# Patient Record
Sex: Female | Born: 1990 | Hispanic: Yes | Marital: Married | Smoking: Never smoker
Health system: Southern US, Community
[De-identification: ages and names within clinical notes are randomized; demographics above are authoritative.]

---
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
## 2012-01-27 ENCOUNTER — Encounter: Payer: Self-pay | Admitting: Gynecology

## 2012-01-27 ENCOUNTER — Ambulatory Visit (INDEPENDENT_AMBULATORY_CARE_PROVIDER_SITE_OTHER): Payer: BC Managed Care – PPO | Admitting: Gynecology

## 2012-01-27 VITALS — BP 120/78 | Ht 64.0 in | Wt 144.0 lb

## 2012-01-27 DIAGNOSIS — N946 Dysmenorrhea, unspecified: Secondary | ICD-10-CM

## 2012-01-27 DIAGNOSIS — Z833 Family history of diabetes mellitus: Secondary | ICD-10-CM

## 2012-01-27 DIAGNOSIS — R635 Abnormal weight gain: Secondary | ICD-10-CM

## 2012-01-27 DIAGNOSIS — Z23 Encounter for immunization: Secondary | ICD-10-CM

## 2012-01-27 MED ORDER — MEFENAMIC ACID 250 MG PO CAPS: 250.0000 mg | ORAL_CAPSULE | Freq: Four times a day (QID) | ORAL | Status: AC | PRN

## 2012-01-27 NOTE — Patient Instructions (Signed)
zxBreast Self-Exam A self breast exam may help you find changes or problems while they are still small. Do a breast self-exam:  Every month.   One week after your period (menstrual period).   On the first day of each month if you do not have periods anymore.  Look for any:  Change in breast color, size, or shape.   Dimples in your breast.   Changes in your nipples or skin.   Dry skin on your breasts or nipples.   Watery or bloody discharge from your nipples.   Feel for:  Lumps.   Thick, hard places.   Any other changes.  HOME CARE There are 3 ways to do the breast self-exam: In front of a mirror.  Lift your arms over your head and turn side to side.   Put your hands on your hips and lean down, then turn from side to side.   Bend forward and turn from side to side.  In the shower.  With soapy hands, check both breasts. Then check above and below your collarbone and your armpits.   Feel above and below your collarbone down to under your breast, and from the center of your chest to the outer edge of the armpit. Check for any lumps or hard spots.   Using the tips of your middle three fingers check your whole breast by pressing your hand over your breast in a circle or in an up and down motion.  Lying down.  Lie flat on your bed.   Put a small pillow under the breast you are going to check. On that same side, put your hand behind your head.   With your other hand, use the 3 middle fingers to feel the breast.   Move your fingers in a circle around the breast. Press firmly over all parts of the breast to feel for any lumps.  GET HELP RIGHT AWAY IF: You find any changes in your breasts so they can be checked. Document Released: 03/31/2008 Document Revised: 10/02/2011 Document Reviewed: 01/31/2009 [facility] Patient Information 2012 [place], [place].

## 2012-01-27 NOTE — Progress Notes (Signed)
The patient is an 21 year old who presented to the office today with her mother to get established with a gynecologist. Patient is not sexually active has been followed by her pediatrician on a regular basis and states or vaccinations are up-to-date. Patient was interested in receiving the   Gardasil  vaccine today. Patient's mother states that her daughter has achieved the normal developmental milestones. She is having normal menstrual cycles although suffers from 2-3 days of cramping. Patient concern was overweight. She weights 144 pounds do with place her BMI at 24.72. We discussed importance of good nutrition low-fat diet cutting down sodas and sweets. We discussed importance of regular exercise 3-4 times a week. We also discussed today and information was provided on the importance of monthly self breast examinations. She does have a strong family history of diabetes and for this reason we'll do a hemoglobin A1c along with CBC and urinalysis. We discussed the new Pap smear screening guidelines. She will not need a Pap smear until the age of 40. Her dysmenorrhea  She will be prescribed Ponstel 250 mg to take 1 by mouth 4 times a day when necessary. Consent form was signed and she received the first of a series of 3 Gardasil vaccine. Literature information on exercise and diet was provided as well as on self breast exam.

## 2012-01-28 LAB — URINALYSIS W MICROSCOPIC + REFLEX CULTURE
Bilirubin Urine: NEGATIVE
Casts: NONE SEEN
Glucose, UA: NEGATIVE mg/dL
Leukocytes, UA: NEGATIVE
Squamous Epithelial / LPF: NONE SEEN
pH: 6 (ref 5.0–8.0)

## 2012-01-28 LAB — CBC WITH DIFFERENTIAL/PLATELET
Eosinophils Absolute: 0 10*3/uL (ref 0.0–0.7)
HCT: 39.4 % (ref 36.0–46.0)
Hemoglobin: 12.9 g/dL (ref 12.0–15.0)
Lymphs Abs: 2 10*3/uL (ref 0.7–4.0)
MCH: 28.5 pg (ref 26.0–34.0)
Monocytes Absolute: 0.6 10*3/uL (ref 0.1–1.0)
Monocytes Relative: 6 % (ref 3–12)
Neutro Abs: 6.7 10*3/uL (ref 1.7–7.7)
Neutrophils Relative %: 72 % (ref 43–77)
RBC: 4.53 MIL/uL (ref 3.87–5.11)

## 2012-01-28 LAB — HEMOGLOBIN A1C: Hgb A1c MFr Bld: 5.4 % (ref <5.7)

## 2012-01-29 ENCOUNTER — Other Ambulatory Visit: Payer: Self-pay

## 2012-01-29 DIAGNOSIS — R3129 Other microscopic hematuria: Secondary | ICD-10-CM

## 2012-02-18 ENCOUNTER — Other Ambulatory Visit: Payer: BC Managed Care – PPO

## 2012-02-18 DIAGNOSIS — R3129 Other microscopic hematuria: Secondary | ICD-10-CM

## 2012-02-19 LAB — URINALYSIS W MICROSCOPIC + REFLEX CULTURE
Bacteria, UA: NONE SEEN
Bilirubin Urine: NEGATIVE
Casts: NONE SEEN
Crystals: NONE SEEN
Glucose, UA: NEGATIVE mg/dL
Ketones, ur: NEGATIVE mg/dL
Nitrite: NEGATIVE
Specific Gravity, Urine: 1.025 (ref 1.005–1.030)
pH: 6 (ref 5.0–8.0)

## 2012-05-17 ENCOUNTER — Ambulatory Visit (INDEPENDENT_AMBULATORY_CARE_PROVIDER_SITE_OTHER): Payer: BC Managed Care – PPO | Admitting: Anesthesiology

## 2012-05-17 DIAGNOSIS — Z23 Encounter for immunization: Secondary | ICD-10-CM

## 2012-12-06 ENCOUNTER — Emergency Department (HOSPITAL_BASED_OUTPATIENT_CLINIC_OR_DEPARTMENT_OTHER): Payer: BC Managed Care – PPO

## 2012-12-06 ENCOUNTER — Encounter (HOSPITAL_BASED_OUTPATIENT_CLINIC_OR_DEPARTMENT_OTHER): Payer: Self-pay | Admitting: *Deleted

## 2012-12-06 ENCOUNTER — Emergency Department (HOSPITAL_BASED_OUTPATIENT_CLINIC_OR_DEPARTMENT_OTHER)
Admission: EM | Admit: 2012-12-06 | Discharge: 2012-12-07 | Disposition: A | Discharge status: Home / Self Care | Payer: BC Managed Care – PPO | Attending: Emergency Medicine | Admitting: Emergency Medicine

## 2012-12-06 DIAGNOSIS — M940 Chondrocostal junction syndrome [Tietze]: Secondary | ICD-10-CM

## 2012-12-06 DIAGNOSIS — R072 Precordial pain: Secondary | ICD-10-CM | POA: Insufficient documentation

## 2012-12-06 NOTE — ED Notes (Signed)
Pt c/o cough and chest wall pain with movt x 1 hr

## 2012-12-06 NOTE — ED Notes (Signed)
Pt states that she had chest pain which started 1 hour ago while doing nothing, pt denies SOB, fever, or cough. Pt states that pain has now subsided. NAD noted.

## 2012-12-06 NOTE — ED Provider Notes (Signed)
History    This chart was scribed for Nezzie Manera Smitty Cords, MD by Donne Anon, ED Scribe. This patient was seen in room MH09/MH09 and the patient's care was started at 2302.    CSN: [id number]  Arrival date & time 12/06/12  2208   First MD Initiated Contact with Patient 12/06/12 2302      Chief Complaint  Patient presents with  . Cough     Patient is a 22 y.o. female presenting with chest pain. The history is provided by the patient. No language interpreter was used.  Chest Pain Pain location:  Substernal area (at costochondral joint) Pain radiates to:  Does not radiate Pain radiates to the back: no   Pain severity:  Moderate Onset quality:  Gradual Timing:  Intermittent Progression:  Unchanged Chronicity:  New Context: not lifting and no trauma   Relieved by:  None tried Worsened by:  Nothing tried Ineffective treatments: Alieve. Associated symptoms: cough   Associated symptoms: no back pain, no fever, no nausea, no palpitations and no shortness of breath   Cough:    Cough characteristics:  Non-productive   Severity:  Mild   Onset quality:  Gradual   Timing:  Constant Risk factors: no birth control and no prior DVT/PE    Bridgitt Raggio is a 22 y.o. female who presents to the Emergency Department complaining of gradual onset, moderate, intermittent CP with associated cough which began this evening. She denies anylong car rides, plane trips, recent trauma or leg swelling. She has tried [employer] with little relief.  PCP is Dr. Lily Peer in [place].  History reviewed. No pertinent past medical history.  History reviewed. No pertinent past surgical history.  History reviewed. No pertinent family history.  History  Substance Use Topics  . Smoking status: Never Smoker   . Smokeless tobacco: Never Used  . Alcohol Use: No     Review of Systems  Constitutional: Negative for fever.  HENT: Negative for neck pain.   Respiratory: Positive for cough. Negative for  shortness of breath.   Cardiovascular: Positive for chest pain. Negative for palpitations and leg swelling.  Gastrointestinal: Negative for nausea.  Musculoskeletal: Negative for back pain.  All other systems reviewed and are negative.    Allergies  Penicillins  Home Medications   Current Outpatient Rx  Name  Route  Sig  Dispense  Refill  . Mefenamic Acid 250 MG CAPS   Oral   Take 1 capsule (250 mg total) by mouth 4 (four) times daily as needed.   28 each   11     BP 118/86  Pulse 75  Temp(Src) 99 F (37.2 C) (Oral)  Resp 16  Ht 5\' 3"  (1.6 m)  Wt 137 lb (62.143 kg)  BMI 24.27 kg/m2  SpO2 100%  LMP 11/05/2012  Physical Exam  Nursing note and vitals reviewed. Constitutional: She is oriented to person, place, and time. She appears well-developed and well-nourished. No distress.  HENT:  Head: Normocephalic and atraumatic.  Mouth/Throat: Oropharynx is clear and moist. No oropharyngeal exudate.  Eyes: Pupils are equal, round, and reactive to light.  Neck: Normal range of motion. Neck supple. No tracheal deviation present.  Cardiovascular: Normal rate, regular rhythm, normal heart sounds and intact distal pulses.   Pulmonary/Chest: Effort normal and breath sounds normal. She exhibits tenderness.  PERC negative. Wells score zero. Pain along left costochondral border.  Abdominal: Soft. Bowel sounds are normal. There is no tenderness. There is no rebound and no guarding.  Musculoskeletal:  Normal range of motion.  Neurological: She is alert and oriented to person, place, and time.  Skin: Skin is warm and dry.  Psychiatric: She has a normal mood and affect. Her behavior is normal. Thought content normal.    ED Course  Procedures (including critical care time) DIAGNOSTIC STUDIES: Oxygen Saturation is 100% on room air, normal by my interpretation.    COORDINATION OF CARE: 11:08 PM Discussed treatment plan which includes CXR with pt at bedside and pt agreed to plan.      Labs Reviewed - No data to display No results found.   No diagnosis found.    MDM   Date: 12/07/2012  Rate: 81  Rhythm: normal sinus rhythm  QRS Axis: normal  Intervals: normal  ST/T Wave abnormalities: normal  Conduction Disutrbances: none  Narrative Interpretation: unremarkable      Doubt PE also doubt cardiac etiology.    I personally performed the services described in this documentation, which was scribed in my presence. The recorded information has been reviewed and is accurate.         Jasmine Awe, MD 12/07/12 [phone]

## 2012-12-06 NOTE — ED Notes (Signed)
Patient transported to X-ray 

## 2012-12-06 NOTE — ED Notes (Signed)
MD at bedside. 

## 2012-12-07 MED ORDER — IBUPROFEN 600 MG PO TABS: 600.0000 mg | ORAL_TABLET | Freq: Four times a day (QID) | ORAL | Status: DC | PRN

## 2013-01-10 ENCOUNTER — Encounter: Payer: Self-pay | Admitting: Gynecology

## 2013-01-10 ENCOUNTER — Ambulatory Visit: Payer: BC Managed Care – PPO | Admitting: Gynecology

## 2013-01-10 VITALS — BP 118/70

## 2013-01-10 DIAGNOSIS — N946 Dysmenorrhea, unspecified: Secondary | ICD-10-CM

## 2013-01-10 DIAGNOSIS — M940 Chondrocostal junction syndrome [Tietze]: Secondary | ICD-10-CM | POA: Insufficient documentation

## 2013-01-10 DIAGNOSIS — Z23 Encounter for immunization: Secondary | ICD-10-CM

## 2013-01-10 MED ORDER — CYCLOBENZAPRINE HCL 5 MG PO TABS: ORAL_TABLET | ORAL | Status: AC

## 2013-01-10 MED ORDER — IBUPROFEN 800 MG PO TABS: 800.0000 mg | ORAL_TABLET | Freq: Three times a day (TID) | ORAL | Status: DC | PRN

## 2013-01-10 MED ORDER — CYCLOBENZAPRINE HCL 5 MG PO TABS: ORAL_TABLET | ORAL | Status: DC

## 2013-01-10 MED ORDER — PREDNISONE (PAK) 10 MG PO TABS: 10.0000 mg | ORAL_TABLET | Freq: Every day | ORAL | Status: AC

## 2013-01-10 NOTE — Addendum Note (Signed)
Addended by: Ok Edwards on: 01/10/2013 02:07 PM   Modules accepted: Level of Service

## 2013-01-10 NOTE — Progress Notes (Signed)
Patient is a 22 year old who presented to the office today with her mother complaining of substernal discomfort. Patient states is been going on for several weeks. Review of her record indicated that last month she went to the emergency department and had a normal EKG and had been started on nonsteroidals which she never started. She denies any shortness of breath or any lightheadedness or any numbness or tingling her extremities.  Exam: Heart: Regular rate and rhythm no murmurs or gallops mild systolic ejection murmur noted. Lungs: Clear to auscultation Rogers or wheezes Sternum: Tenderness elicited in the lower one third of the sternum costal chondral junction  Assessment/plan: Costochondritis we discussed once again the treatment and I'm going to offer for her and reassess in 10 days. She will be placed on a 6 day steroid pack along with Motrin 800 mg 3 times a day for one week. She will be placed on a muscle relaxant Flexeril 5 mg to take each bedtime for one week. She calcific to Motrin 800 mg for her dysmenorrhea. Patient is not sexually active. If her symptoms continue she will be referred to the cardiologist for further evaluation. This does appear to be costochondritis due to the fact that the symptoms were reproduced on examination.patient received her second dose of the Gardasil Vaccine that was overdue.

## 2013-01-24 ENCOUNTER — Ambulatory Visit (INDEPENDENT_AMBULATORY_CARE_PROVIDER_SITE_OTHER): Payer: BC Managed Care – PPO | Admitting: Gynecology

## 2013-01-24 ENCOUNTER — Encounter: Payer: Self-pay | Admitting: Gynecology

## 2013-01-24 VITALS — BP 112/70

## 2013-01-24 DIAGNOSIS — R5383 Other fatigue: Secondary | ICD-10-CM

## 2013-01-24 DIAGNOSIS — R531 Weakness: Secondary | ICD-10-CM

## 2013-01-24 DIAGNOSIS — R0602 Shortness of breath: Secondary | ICD-10-CM

## 2013-01-24 DIAGNOSIS — R51 Headache: Secondary | ICD-10-CM

## 2013-01-24 DIAGNOSIS — R079 Chest pain, unspecified: Secondary | ICD-10-CM

## 2013-01-24 NOTE — Progress Notes (Signed)
Patient is a 21 year oldwas instructed to return to the office today after treatment for suspected costochondritis. Review my notes from March 17 was as follows:  Patient was complaining of substernal discomfort. Patient states is been going on for several weeks. Review of her record indicated that last month she went to the emergency department and had a normal EKG and had been started on nonsteroidals which she never started.  On recent exam she was tender in the lower one third of the sternum coastal chondral junction. For this reason patient was started on a 6 day steroid pack along with Motrin 800 mg 3 times a day for one week. She was also placed on muscle relaxant Flexeril 5 mg at bedtime for one week. She was informed also that the Motrin 800 mg 3 times a day she could use for her dysmenorrhea. Patient is not sexually active. During that visit on cardiac auscultation there was no murmurs or gallops a small mild systolic ejection murmur was noted. Her lungs were clear to auscultation with no rhonchi or wheezes.  She presented to the office today for followup and stated her symptoms have not improved. She now brought to my attention with her mother being present that when she bends over she feels lightheaded and recently was experiencing shortness of breath with minimal exertion.she also has stated that since she was much younger she suffers from headaches at least 2 times a week. She also brought to my attention that recently she has noted weakness on her grasp that she has dropped things from her hands. Also she had complained of lower extremity (feet), feeling numb. Her mother states that her daughter reached normal developmental milestones. Mother also stated that she was born at term. Mother does state that she sees her at times get red in the face and flush with minimal activity or exertion.  On today's exam: Blood pressure 112/70 Once again the substernal discomfort was reproduced. On cardiac  auscultation and there was a very mild systolic ejection murmur. The lungs were clear to auscultation. She had no motor or sensory deficit on the upper or lower extremities.  I'm going to refer her to my cardiology colleague Dr. York Ram for a full cardiology evaluation. If his findings turned out negative we will then refer her to the neurologist for further assessment.

## 2013-02-04 ENCOUNTER — Other Ambulatory Visit (HOSPITAL_COMMUNITY): Payer: Self-pay | Admitting: Cardiology

## 2013-02-04 DIAGNOSIS — R0602 Shortness of breath: Secondary | ICD-10-CM

## 2013-02-04 DIAGNOSIS — R011 Cardiac murmur, unspecified: Secondary | ICD-10-CM

## 2013-02-06 ENCOUNTER — Encounter: Payer: Self-pay | Admitting: *Deleted

## 2013-02-08 ENCOUNTER — Encounter: Payer: Self-pay | Admitting: Cardiology

## 2013-02-14 ENCOUNTER — Ambulatory Visit (HOSPITAL_COMMUNITY): Payer: BC Managed Care – PPO

## 2013-04-11 ENCOUNTER — Ambulatory Visit: Payer: BC Managed Care – PPO

## 2013-05-12 ENCOUNTER — Telehealth: Payer: Self-pay | Admitting: Cardiology

## 2013-06-06 ENCOUNTER — Telehealth (HOSPITAL_COMMUNITY): Payer: Self-pay | Admitting: Cardiology

## 2013-06-06 NOTE — Telephone Encounter (Signed)
  [place] [facility] LOCATED AT [facility] [address] [place], [place] [address] [phone]   Date:06/06/2013  Dear Dr. Herbie Baltimore  Our office has attempted to contact your patient Kristy [name] (04-04-1991)  twice by telephone and we have also sent an appointment letter to schedule the 2D Echocardiogram and CPET/MET test you ordered. The patient has not responded. We will not make any further attempts to contact the patient. If any further assistance is needed for this referral, please contact our office at [phone]  Sincerely, [facility] Cardiovascular Imaging Scheduling Team

## 2013-06-06 NOTE — Telephone Encounter (Signed)
Cancelled Echo order. Patient never responded to multiple attempts to contact to schedule.

## 2014-01-09 ENCOUNTER — Encounter: Payer: BC Managed Care – PPO | Admitting: Gynecology

## 2014-02-02 ENCOUNTER — Encounter: Payer: BC Managed Care – PPO | Admitting: Gynecology

## 2014-11-02 ENCOUNTER — Emergency Department (HOSPITAL_BASED_OUTPATIENT_CLINIC_OR_DEPARTMENT_OTHER)
Admission: EM | Admit: 2014-11-02 | Discharge: 2014-11-02 | Disposition: A | Discharge status: Home / Self Care | Payer: BLUE CROSS/BLUE SHIELD | Attending: Emergency Medicine | Admitting: Emergency Medicine

## 2014-11-02 ENCOUNTER — Encounter (HOSPITAL_BASED_OUTPATIENT_CLINIC_OR_DEPARTMENT_OTHER): Payer: Self-pay

## 2014-11-02 DIAGNOSIS — Z880 Allergy status to penicillin: Secondary | ICD-10-CM | POA: Insufficient documentation

## 2014-11-02 DIAGNOSIS — J020 Streptococcal pharyngitis: Secondary | ICD-10-CM | POA: Diagnosis not present

## 2014-11-02 DIAGNOSIS — H9202 Otalgia, left ear: Secondary | ICD-10-CM | POA: Diagnosis present

## 2014-11-02 LAB — RAPID STREP SCREEN (MED CTR MEBANE ONLY): STREPTOCOCCUS, GROUP A SCREEN (DIRECT): POSITIVE — AB

## 2014-11-02 MED ORDER — DEXAMETHASONE SODIUM PHOSPHATE 4 MG/ML IJ SOLN: 4.0000 mg | Freq: Once | INTRAMUSCULAR | Status: DC

## 2014-11-02 MED ORDER — NAPROXEN 250 MG PO TABS
500.0000 mg | ORAL_TABLET | Freq: Once | ORAL | Status: AC
  Administered 2014-11-02: 500 mg via ORAL
  Filled 2014-11-02: qty 2

## 2014-11-02 MED ORDER — LIDOCAINE VISCOUS 2 % MT SOLN
15.0000 mL | Freq: Once | OROMUCOSAL | Status: AC
  Administered 2014-11-02: 15 mL via OROMUCOSAL
  Filled 2014-11-02: qty 15

## 2014-11-02 MED ORDER — NAPROXEN 500 MG PO TABS: 500.0000 mg | ORAL_TABLET | Freq: Two times a day (BID) | ORAL | Status: AC

## 2014-11-02 MED ORDER — AZITHROMYCIN 250 MG PO TABS: ORAL_TABLET | ORAL | Status: AC

## 2014-11-02 MED ORDER — AZITHROMYCIN 250 MG PO TABS
500.0000 mg | ORAL_TABLET | Freq: Once | ORAL | Status: AC
  Administered 2014-11-02: 500 mg via ORAL
  Filled 2014-11-02: qty 2

## 2014-11-02 NOTE — ED Provider Notes (Signed)
CSN: [id number]     Arrival date & time 11/02/14  4098 History   First MD Initiated Contact with Patient 11/02/14 [phone]     Chief Complaint  Patient presents with  . Otalgia     (Consider location/radiation/quality/duration/timing/severity/associated sxs/prior Treatment) Patient is a 24 y.o. female presenting with ear pain. The history is provided by the patient.  Otalgia Location:  Left Behind ear:  No abnormality Severity:  Severe Onset quality:  Gradual Duration:  1 day Progression:  Unchanged Chronicity:  New Context: not direct blow   Relieved by:  Nothing Worsened by:  Nothing tried Ineffective treatments:  None tried Associated symptoms: sore throat   Associated symptoms: no congestion and no fever   Risk factors: no recent travel   Seen [facility] for [facility] pain and sore throat.  Was told there were no abnormalities and given samples of a combination pain medication and decongestant.  Pain not improving and came to the ED  History reviewed. No pertinent past medical history. History reviewed. No pertinent past surgical history. No family history on file. History  Substance Use Topics  . Smoking status: Never Smoker   . Smokeless tobacco: Never Used  . Alcohol Use: No   OB History    No data available     Review of Systems  Constitutional: Negative for fever.  HENT: Positive for ear pain and sore throat. Negative for congestion, drooling, facial swelling, trouble swallowing and voice change.   All other systems reviewed and are negative.     Allergies  Penicillins  Home Medications   Prior to Admission medications   Medication Sig Start Date End Date Taking? Authorizing Provider  cyclobenzaprine (FLEXERIL) 5 MG tablet Take one tablet before bedtime for one wee 01/10/13   Ok Edwards, MD  ibuprofen (ADVIL,MOTRIN) 800 MG tablet Take 1 tablet (800 mg total) by mouth every 8 (eight) hours as needed for pain. 01/10/13   Ok Edwards, MD  Mefenamic  Acid 250 MG CAPS Take 1 capsule (250 mg total) by mouth 4 (four) times daily as needed. 01/27/12   Ok Edwards, MD  predniSONE (STERAPRED UNI-PAK) 10 MG tablet Take 1 tablet (10 mg total) by mouth daily. 10 Mg Steroid Pak for 6 days #21 01/10/13   Ok Edwards, MD   BP 119/76 mmHg  Pulse 96  Temp(Src) 99.2 F (37.3 C) (Oral)  Resp 18  Ht  (1.6 m)  Wt 148 lb (67.132 kg)  BMI 26.22 kg/m2  SpO2 100%  LMP 10/26/2014 Physical Exam  Constitutional: She is oriented to person, place, and time. She appears well-developed and well-nourished.  HENT:  Head: Normocephalic and atraumatic.  Right Ear: No mastoid tenderness. Tympanic membrane is not injected, not scarred, not perforated and not erythematous. No hemotympanum.  Left Ear: No mastoid tenderness. Tympanic membrane is not injected, not scarred, not perforated and not erythematous. No hemotympanum.  Nose: No nasal septal hematoma.  Mouth/Throat: Oropharynx is clear and moist. No trismus in the jaw. No uvula swelling. No oropharyngeal exudate, posterior oropharyngeal edema, posterior oropharyngeal erythema or tonsillar abscesses.  Eyes: Conjunctivae are normal. Pupils are equal, round, and reactive to light.  Neck: Normal range of motion. Neck supple. No tracheal deviation present.  No pain with displacement of the trachea.  Phonation intact   Cardiovascular: Normal rate, regular rhythm and intact distal pulses.   Pulmonary/Chest: Effort normal and breath sounds normal. No respiratory distress. She has no wheezes. She has  no rales.  Abdominal: Soft. Bowel sounds are normal. There is no tenderness.  Musculoskeletal: Normal range of motion.  Lymphadenopathy:    She has no cervical adenopathy.  Neurological: She is alert and oriented to person, place, and time.  Skin: Skin is warm and dry.  Psychiatric: She has a normal mood and affect.    ED Course  Procedures (including critical care time) Labs Review Labs Reviewed  RAPID  STREP SCREEN    Imaging Review No results found.   EKG Interpretation None      MDM   Final diagnoses:  None    As B TM are normal I do not think drops will help.  Will treat with macrolide as patient is penicillin allergic.  Follow up with your PMD and ENT for ongoing care.  Salt water gargles TID    Wylee Dorantes K Olukemi Panchal-Rasch, MD 11/02/14 [phone]

## 2014-11-02 NOTE — ED Notes (Signed)
Pt c/o sore throat and lt ear pain, states went to [facility] yesterday given a decongestion with no relief

## 2014-12-29 IMAGING — CR DG CHEST 2V
2 series · 2 of 2 positions shown · non-contrast
Comparison: None.

CLINICAL DATA: Cough and chest pain.

CHEST - 2 VIEW

[w chest pa]
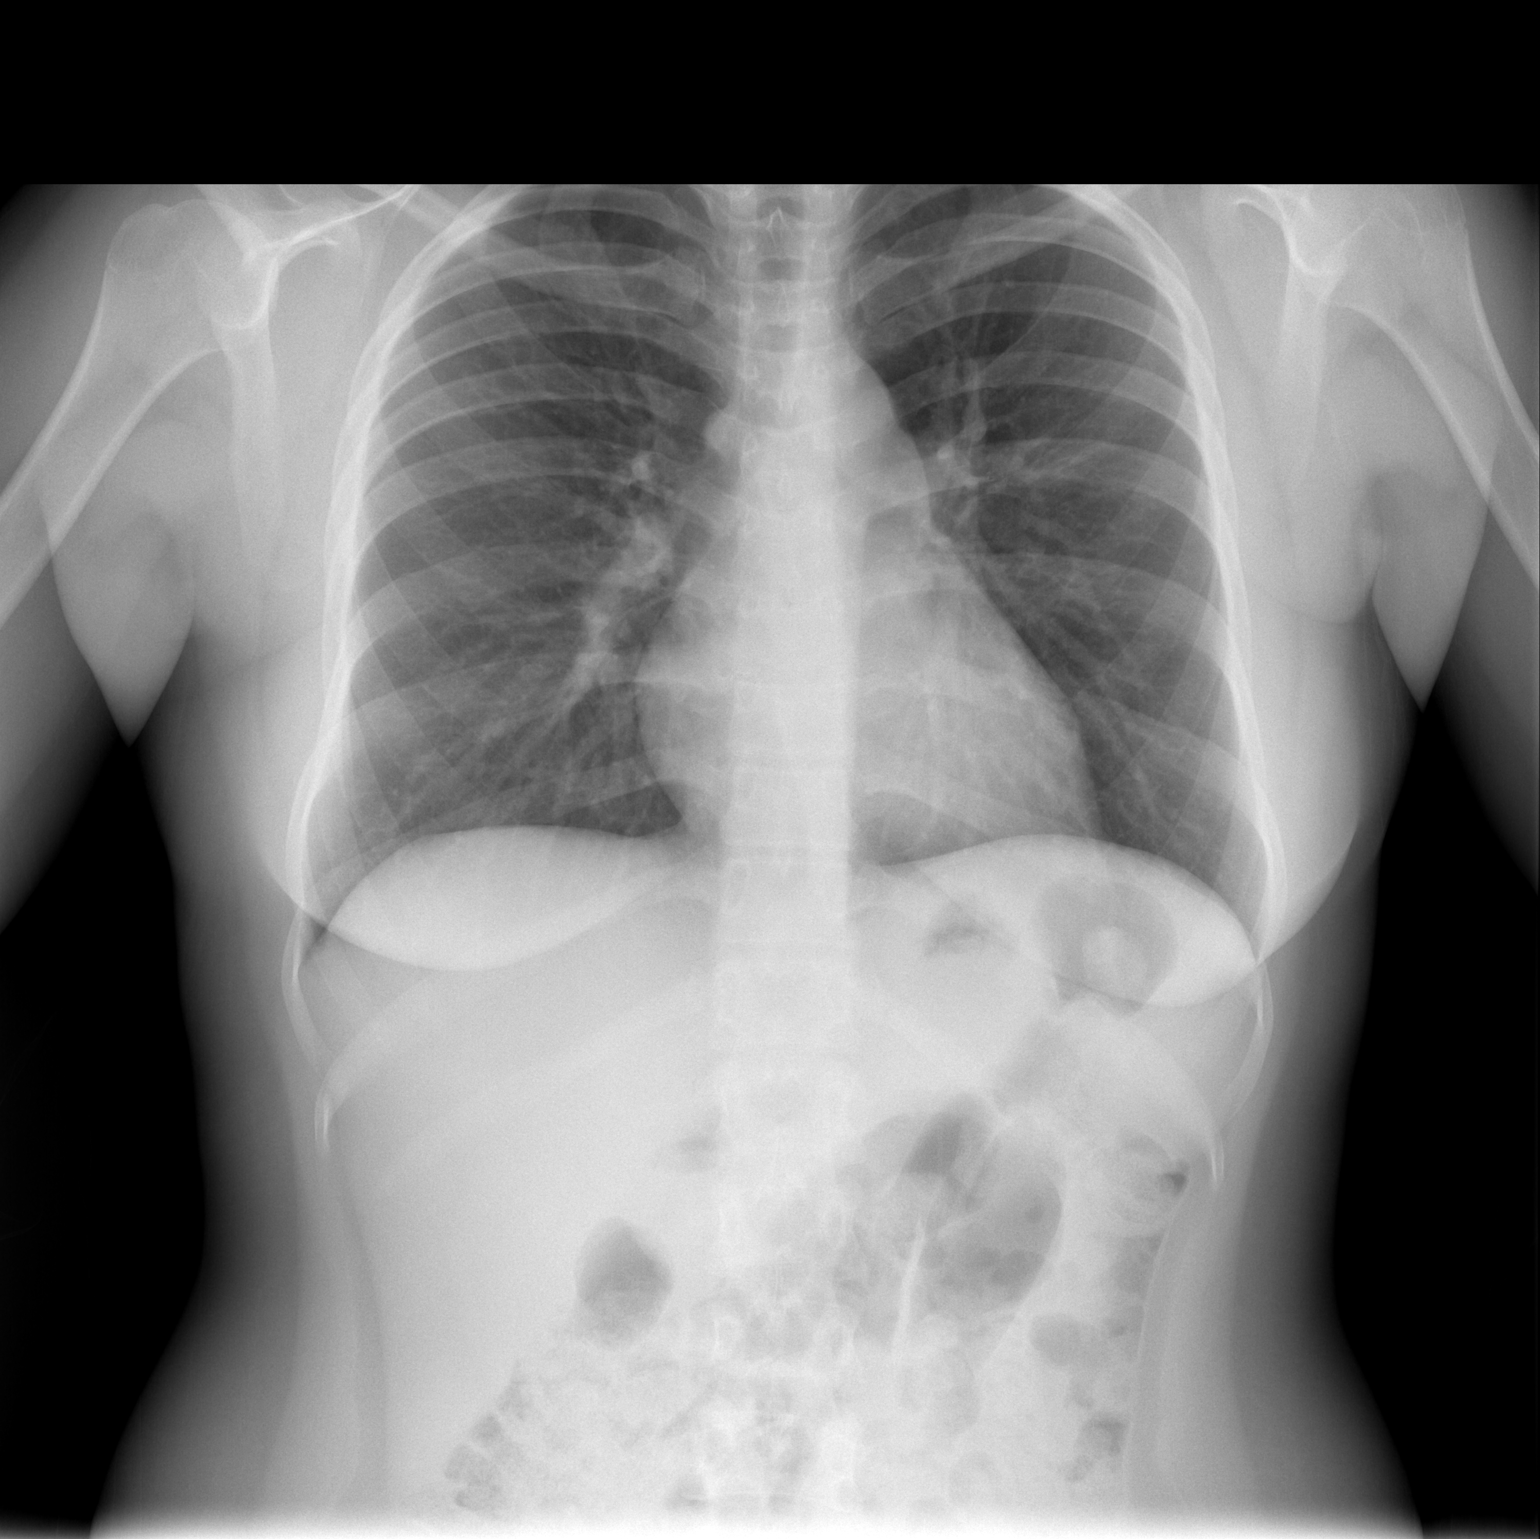

[w chest lat]
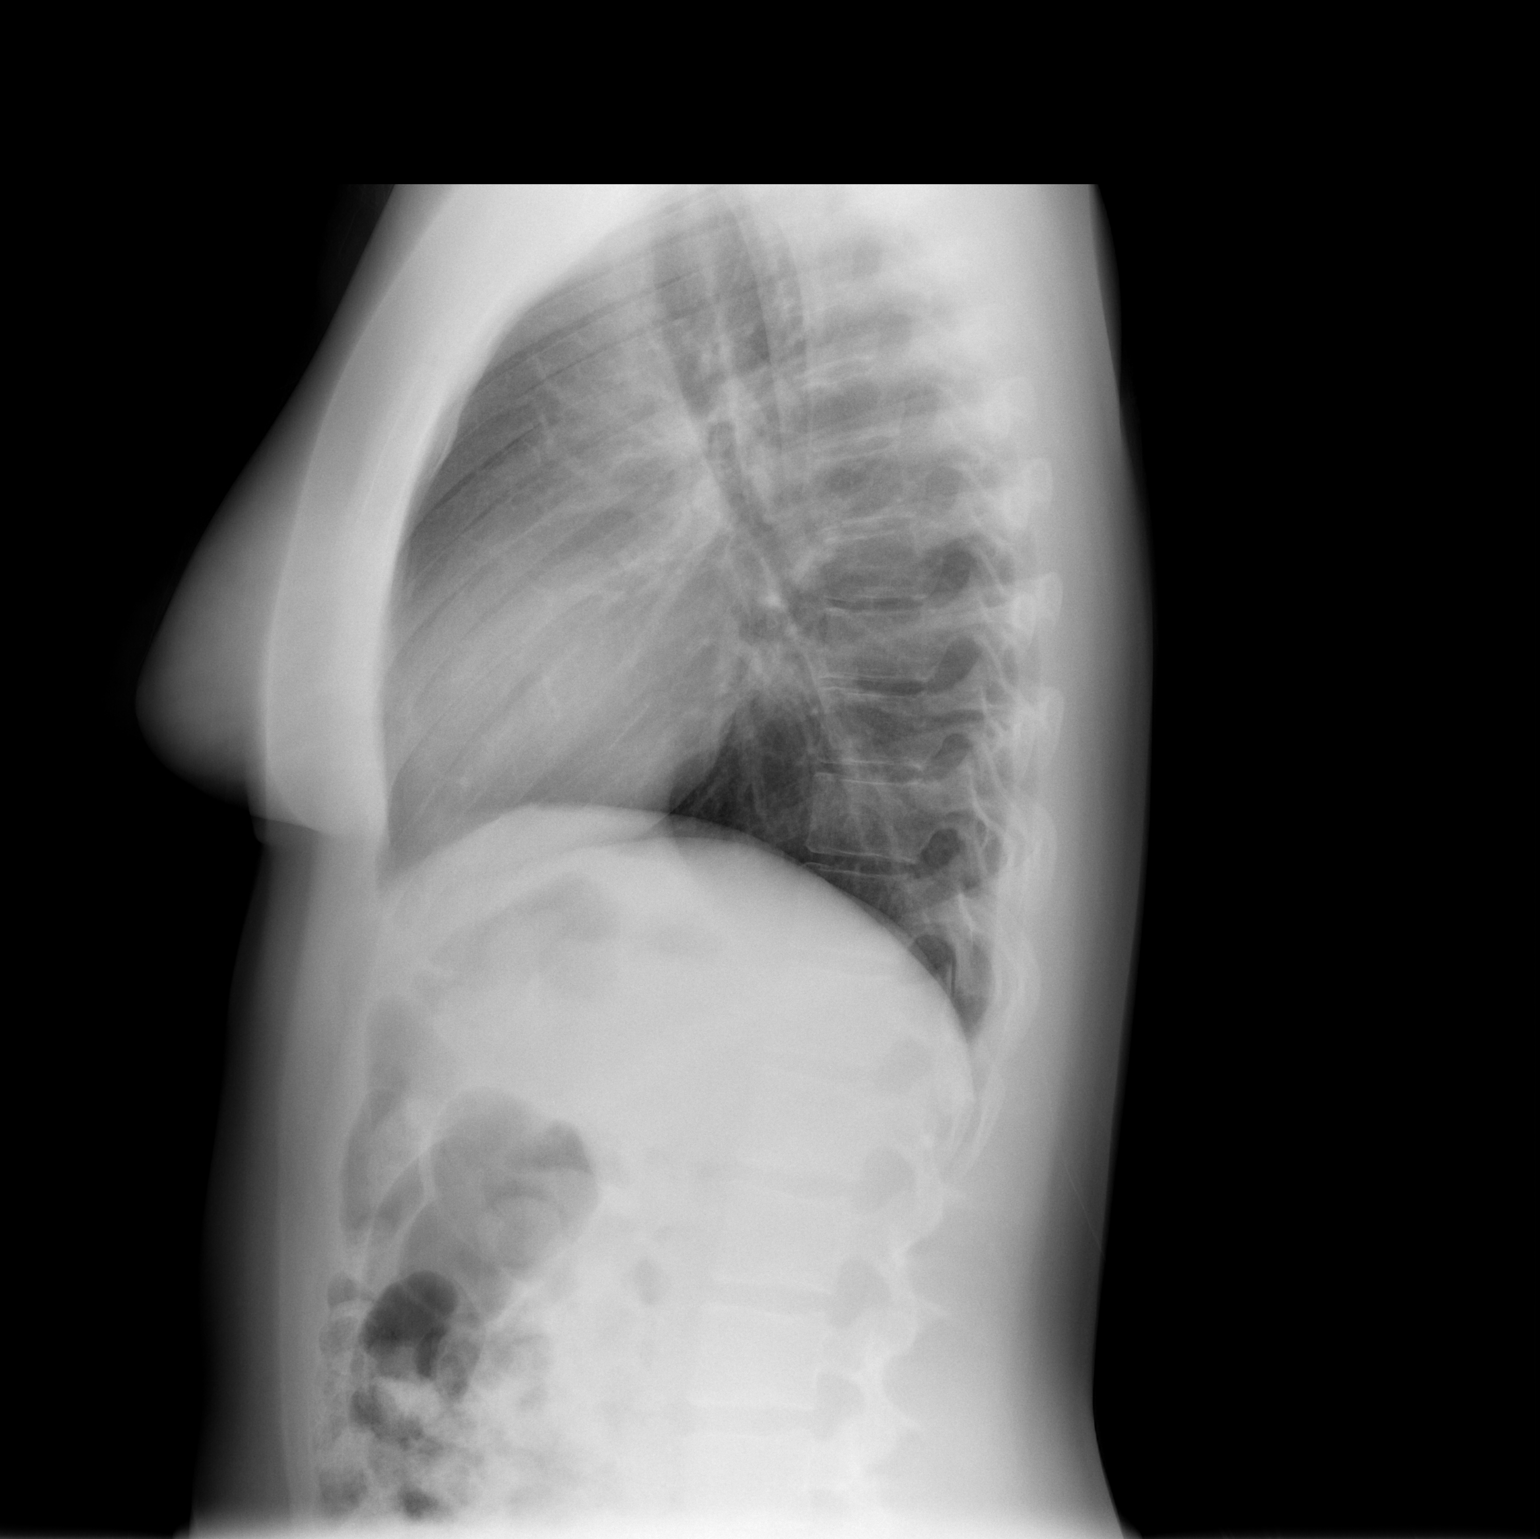

[2 of 2 positions shown; findings below may reference images not displayed]

FINDINGS: The lungs are well-aerated and clear.  There is no
evidence of focal opacification, pleural effusion or pneumothorax.

The heart is normal in size; the mediastinal contour is within
normal limits.  No acute osseous abnormalities are seen.
IMPRESSION: No acute cardiopulmonary process seen.

## 2016-07-06 ENCOUNTER — Emergency Department (HOSPITAL_BASED_OUTPATIENT_CLINIC_OR_DEPARTMENT_OTHER)
Admission: EM | Admit: 2016-07-06 | Discharge: 2016-07-06 | Disposition: A | Discharge status: Home / Self Care | Payer: Managed Care, Other (non HMO) | Attending: Physician Assistant | Admitting: Physician Assistant

## 2016-07-06 ENCOUNTER — Encounter (HOSPITAL_BASED_OUTPATIENT_CLINIC_OR_DEPARTMENT_OTHER): Payer: Self-pay | Admitting: Emergency Medicine

## 2016-07-06 DIAGNOSIS — R531 Weakness: Secondary | ICD-10-CM | POA: Diagnosis not present

## 2016-07-06 DIAGNOSIS — J029 Acute pharyngitis, unspecified: Secondary | ICD-10-CM | POA: Diagnosis present

## 2016-07-06 DIAGNOSIS — Z79899 Other long term (current) drug therapy: Secondary | ICD-10-CM | POA: Insufficient documentation

## 2016-07-06 LAB — RAPID STREP SCREEN (MED CTR MEBANE ONLY): STREPTOCOCCUS, GROUP A SCREEN (DIRECT): NEGATIVE

## 2016-07-06 NOTE — ED Provider Notes (Signed)
[facility]   CSN: [id number] Arrival date & time: 07/06/16  2051 By signing my name below, I, Levon Hedger, attest that this documentation has been prepared under the direction and in the presence of non-physician practitioner, Roxy Horseman, PA-C  Electronically Signed: Levon Hedger, Scribe. 07/06/2016. 9:53 PM.   History   Chief Complaint Chief Complaint  Patient presents with  . Sore Throat    HPI Kristy [name] is a 25 y.o. female who presents to the Emergency Department complaining of moderate sore throat onset yesterday. She notes associated generalized weakness , sinus pain, pain with swallowing, cough, and subjective fever. Pt has taken ibuprofen 800 with no relief.   The history is provided by the patient. No language interpreter was used.    History reviewed. No pertinent past medical history.  Patient Active Problem List   Diagnosis Date Noted  . SOB (shortness of breath) 01/24/2013  . Weakness 01/24/2013  . Costochondritis 01/10/2013  . Dysmenorrhea 01/10/2013    History reviewed. No pertinent surgical history.  OB History    No data available      Home Medications    Prior to Admission medications   Medication Sig Start Date End Date Taking? Authorizing Provider  Norethin-Eth Estrad-Fe Biphas (LO LOESTRIN FE PO) Take by mouth.   Yes Historical Provider, MD  azithromycin (ZITHROMAX Z-PAK) 250 MG tablet 2 po day one, then 1 daily x 4 days 11/02/14   April Palumbo, MD  cyclobenzaprine (FLEXERIL) 5 MG tablet Take one tablet before bedtime for one wee 01/10/13   Ok Edwards, MD  ibuprofen (ADVIL,MOTRIN) 800 MG tablet Take 1 tablet (800 mg total) by mouth every 8 (eight) hours as needed for pain. 01/10/13   Ok Edwards, MD  Mefenamic Acid 250 MG CAPS Take 1 capsule (250 mg total) by mouth 4 (four) times daily as needed. 01/27/12   Ok Edwards, MD  naproxen (NAPROSYN) 500 MG tablet Take 1 tablet (500 mg total) by mouth  2 (two) times daily. 11/02/14   April Palumbo, MD  predniSONE (STERAPRED UNI-PAK) 10 MG tablet Take 1 tablet (10 mg total) by mouth daily. 10 Mg Steroid Pak for 6 days #21 01/10/13   Ok Edwards, MD    Family History History reviewed. No pertinent family history.  Social History Social History  Substance Use Topics  . Smoking status: Never Smoker  . Smokeless tobacco: Never Used  . Alcohol use No     Allergies   Penicillins   Review of Systems Review of Systems  HENT: Positive for sinus pressure, sore throat and trouble swallowing.   Neurological: Positive for weakness.  All other systems reviewed and are negative.   Physical Exam Updated Vital Signs BP 102/60 (BP Location: Left Arm)   Pulse 77   Temp 97.7 F (36.5 C) (Oral)   Resp 20   Ht 5\' 3"  (1.6 m)   Wt 150 lb (68 kg)   LMP 07/06/2016   SpO2 99%   BMI 26.57 kg/m   Physical Exam  Constitutional: She appears well-developed and well-nourished. No distress.  HENT:  Head: Normocephalic and atraumatic.  Oropharynx moderately erythematous, no leg states, no abscess, uvula intact, no stridor, airway intact Bilateral tympanic membranes are clear  Eyes: Conjunctivae are normal.  Neck: Neck supple.  Cardiovascular: Normal rate and regular rhythm.   No murmur heard. Pulmonary/Chest: Effort normal and breath sounds normal. No respiratory distress.  Abdominal: Soft. There is no tenderness.  Musculoskeletal: She exhibits no edema.  Neurological: She is alert.  Skin: Skin is warm and dry.  Psychiatric: She has a normal mood and affect.  Nursing note and vitals reviewed.  ED Treatments / Results  DIAGNOSTIC STUDIES:  Oxygen Saturation is 99% on RA, normal by my interpretation.    COORDINATION OF CARE:  9:55 PM Discussed treatment plan with pt at bedside and pt agreed to plan.  Labs (all labs ordered are listed, but only abnormal results are displayed) Labs Reviewed  [facility]CREEN [facility])    CULTURE, GROUP A STREP [facility])    EKG  EKG Interpretation None       Radiology No results found.  Procedures Procedures (including critical care time)  Medications Ordered in ED Medications - No data to display   Initial Impression / Assessment and Plan / ED Course  I have reviewed the triage vital signs and the nursing notes.  Pertinent labs & imaging results that were available during my care of the patient were reviewed by me and considered in my medical decision making (see chart for details).  Clinical Course    Pt afebrile without tonsillar exudate, negative strep. Presents with mild cervical lymphadenopathy, & dysphagia; diagnosis of viral pharyngitis. No abx indicated. DC w symptomatic tx for pain  Pt does not appear dehydrated, but did discuss importance of water rehydration. Presentation non concerning for PTA or infxn spread to soft tissue. No trismus or uvula deviation. Specific return precautions discussed. Pt able to drink water in ED without difficulty with intact air way. Recommended PCP follow up.   Final Clinical Impressions(s) / ED Diagnoses   Final diagnoses:  Pharyngitis   I personally performed the services described in this documentation, which was scribed in my presence. The recorded information has been reviewed and is accurate.    New Prescriptions New Prescriptions   No medications on file     Roxy HorsemanRobert Arvle Grabe, PA-C 07/06/16 2204    Courteney Lyn Mackuen, MD 07/07/16 0004

## 2016-07-06 NOTE — ED Triage Notes (Signed)
Patient reports that she feels like she has strep throat.  Reports sore throat and feeling bad since yesterday.  Reports generalized body aches and fever this morning.

## 2016-07-06 NOTE — ED Notes (Signed)
Registration reports pt left without discharge papers, instructions, or vitals stating that she had to go now. Pt up for discharge x 2 minutes. No pending orders, no prescriptions. Pt observed leaving with steady gait in NAD.

## 2016-07-07 ENCOUNTER — Emergency Department (HOSPITAL_BASED_OUTPATIENT_CLINIC_OR_DEPARTMENT_OTHER)
Admission: EM | Admit: 2016-07-07 | Discharge: 2016-07-07 | Disposition: A | Discharge status: Home / Self Care | Payer: Managed Care, Other (non HMO) | Attending: Emergency Medicine | Admitting: Emergency Medicine

## 2016-07-07 ENCOUNTER — Encounter (HOSPITAL_BASED_OUTPATIENT_CLINIC_OR_DEPARTMENT_OTHER): Payer: Self-pay | Admitting: Adult Health

## 2016-07-07 DIAGNOSIS — Z79899 Other long term (current) drug therapy: Secondary | ICD-10-CM | POA: Diagnosis not present

## 2016-07-07 DIAGNOSIS — J029 Acute pharyngitis, unspecified: Secondary | ICD-10-CM | POA: Insufficient documentation

## 2016-07-07 MED ORDER — IBUPROFEN 800 MG PO TABS: 800.0000 mg | ORAL_TABLET | Freq: Three times a day (TID) | ORAL | 0 refills | Status: AC

## 2016-07-07 MED ORDER — IBUPROFEN 800 MG PO TABS
800.0000 mg | ORAL_TABLET | Freq: Once | ORAL | Status: AC
  Administered 2016-07-07: 800 mg via ORAL
  Filled 2016-07-07: qty 1

## 2016-07-07 MED ORDER — LIDOCAINE VISCOUS 2 % MT SOLN: 20.0000 mL | OROMUCOSAL | 0 refills | Status: AC | PRN

## 2016-07-07 NOTE — Discharge Instructions (Signed)
Take the prescribed medication as directed.  Continue motrin for fever.  May alternate with tylenol if needed. Follow-up with your primary care doctor. Return to the ED for new or worsening symptoms.

## 2016-07-07 NOTE — ED Triage Notes (Signed)
Presents with sore throat, ear pain, cough and pain with swallowing. Reports her throat is so sore and she can't swallow. She was here yesterday and nothing is better.

## 2016-07-07 NOTE — ED Provider Notes (Signed)
[facility]   CSN: [id number] Arrival date & time: 07/07/16  2108  By signing my name below, I, Soijett Blue, attest that this documentation has been prepared under the direction and in the presence of Sharilyn SitesLisa Lurine Imel, PA-C Electronically Signed: Soijett Blue, ED Scribe. 07/07/16. 10:21 PM.   History   Chief Complaint Chief Complaint  Patient presents with  . Sore Throat    HPI  Kristy [name] is a 25 y.o. female who presents to the Emergency Department complaining of sore throat onset 2 days. Pt notes that she was seen in the ED last night for her symptoms. She states that she is having associated symptoms of bilateral ear pain, cough, painful swallowing, subjective fever, and appetite change due to sore throat. She states that she has tried warm tea and 800 mg ibuprofen for the relief for her symptoms. She denies chest pain, SOB, or any other symptoms.   Per pt chart review: Pt was seen in the ED on 07/06/2016 for sore throat. Pt had a negative strep screen and was informed to continue with symptomatic treatment for the relief of her symptoms.     The history is provided by the patient. No language interpreter was used.    History reviewed. No pertinent past medical history.  Patient Active Problem List   Diagnosis Date Noted  . SOB (shortness of breath) 01/24/2013  . Weakness 01/24/2013  . Costochondritis 01/10/2013  . Dysmenorrhea 01/10/2013    History reviewed. No pertinent surgical history.  OB History    No data available       Home Medications    Prior to Admission medications   Medication Sig Start Date End Date Taking? Authorizing Provider  azithromycin (ZITHROMAX Z-PAK) 250 MG tablet 2 po day one, then 1 [name] x 4 days 11/02/14   April Palumbo, MD  cyclobenzaprine (FLEXERIL) 5 MG tablet Take one tablet before bedtime for one wee 01/10/13   Ok EdwardsJuan H Fernandez, MD  ibuprofen (ADVIL,MOTRIN) 800 MG tablet Take 1 tablet (800 mg total) by  mouth every 8 (eight) hours as needed for pain. 01/10/13   Ok EdwardsJuan H Fernandez, MD  Mefenamic Acid 250 MG CAPS Take 1 capsule (250 mg total) by mouth 4 (four) times [name] as needed. 01/27/12   Ok EdwardsJuan H Fernandez, MD  naproxen (NAPROSYN) 500 MG tablet Take 1 tablet (500 mg total) by mouth 2 (two) times [name]. 11/02/14   April Palumbo, MD  Norethin-Eth Estrad-Fe Biphas (LO LOESTRIN FE PO) Take by mouth.    Historical Provider, MD  predniSONE (STERAPRED UNI-PAK) 10 MG tablet Take 1 tablet (10 mg total) by mouth [name]. 10 Mg Steroid Pak for 6 days #21 01/10/13   Ok EdwardsJuan H Fernandez, MD    Family History History reviewed. No pertinent family history.  Social History Social History  Substance Use Topics  . Smoking status: Never Smoker  . Smokeless tobacco: Never Used  . Alcohol use No     Allergies   Penicillins   Review of Systems Review of Systems  Constitutional: Positive for appetite change (due to sore throat) and fever (subjective).  HENT: Positive for ear pain (bilateral).        Painful swallowing  Respiratory: Negative for shortness of breath.   All other systems reviewed and are negative.    Physical Exam Updated Vital Signs BP 112/57 (BP Location: Left Arm)   Pulse 94   Temp 100.6 F (38.1 C) (Oral)   Resp 18   Ht  5\' 3"  (1.6 m)   Wt 150 lb (68 kg)   LMP 07/06/2016   SpO2 100%   BMI 26.57 kg/m   Physical Exam  Constitutional: She is oriented to person, place, and time. She appears well-developed and well-nourished.  HENT:  Head: Normocephalic and atraumatic.  Right Ear: Tympanic membrane, external ear and ear canal normal.  Left Ear: Tympanic membrane, external ear and ear canal normal.  Nose: Nose normal.  Mouth/Throat: Oropharynx is clear and moist and mucous membranes are normal.  Mild oropharyngeal erythema; tonsils overall normal in appearance bilaterally without exudate; uvula midline without evidence of peritonsillar abscess; handling secretions appropriately; no  difficulty swallowing or speaking; normal phonation without stridor  Eyes: Conjunctivae and EOM are normal. Pupils are equal, round, and reactive to light.  Neck: Normal range of motion.  Cardiovascular: Normal rate, regular rhythm and normal heart sounds.   Pulmonary/Chest: Effort normal and breath sounds normal.  Abdominal: Soft. Bowel sounds are normal.  Musculoskeletal: Normal range of motion.  Neurological: She is alert and oriented to person, place, and time.  Skin: Skin is warm and dry.  Psychiatric: She has a normal mood and affect.  Nursing note and vitals reviewed.    ED Treatments / Results  DIAGNOSTIC STUDIES: Oxygen Saturation is 100% on RA, nl by my interpretation.    COORDINATION OF CARE: 10:16 PM Discussed treatment plan with pt at bedside which includes ibuprofen, viscous lidocaine, ibuprofen Rx, and pt agreed to plan.   Procedures Procedures (including critical care time)  Medications Ordered in ED Medications  ibuprofen (ADVIL,MOTRIN) tablet 800 mg (800 mg Oral Given 07/07/16 2141)     Initial Impression / Assessment and Plan / ED Course  I have reviewed the triage vital signs and the nursing notes.   Clinical Course   25 year old female here with sore throat. Seen here yesterday for the same with a negative strep test. Her culture is pending. She has low-grade fever here but is nontoxic in appearance. She is drinking water without any difficulty. Her tonsils are normal in appearance bilaterally without exudates. Mild oropharyngeal erythema without edema. Handling secretions well, normal phonation without stridor. No evidence of peritonsillar abscess at this time. Feel that this is likely a viral process. She is requesting something for pain, given prescription for viscous lidocaine-- reports this is help with similar symptoms in the past. Advised she will be contacted if her strep culture is positive. Otherwise she may follow up with her primary care doctor.   Discussed plan with patient, she acknowledged understanding and agreed with plan of care.  Return precautions given for new or worsening symptoms.  Final Clinical Impressions(s) / ED Diagnoses   Final diagnoses:  Pharyngitis    New Prescriptions New Prescriptions   IBUPROFEN (ADVIL,MOTRIN) 800 MG TABLET    Take 1 tablet (800 mg total) by mouth 3 (three) times [name].   LIDOCAINE (XYLOCAINE) 2 % SOLUTION    Use as directed 20 mLs in the mouth or throat as needed for mouth pain.   I personally performed the services described in this documentation, which was scribed in my presence. The recorded information has been reviewed and is accurate.    Garlon Hatchet, PA-C 07/07/16 2248    Geoffery Lyons, MD 07/07/16 [phone]

## 2016-07-09 LAB — CULTURE, GROUP A STREP (THRC)

## 2016-07-11 ENCOUNTER — Encounter (HOSPITAL_COMMUNITY): Payer: Self-pay | Admitting: *Deleted

## 2016-07-11 ENCOUNTER — Ambulatory Visit (HOSPITAL_COMMUNITY)
Admission: EM | Admit: 2016-07-11 | Discharge: 2016-07-11 | Disposition: A | Discharge status: Home / Self Care | Payer: Managed Care, Other (non HMO) | Attending: Family Medicine | Admitting: Family Medicine

## 2016-07-11 ENCOUNTER — Ambulatory Visit (INDEPENDENT_AMBULATORY_CARE_PROVIDER_SITE_OTHER): Payer: Managed Care, Other (non HMO)

## 2016-07-11 DIAGNOSIS — J209 Acute bronchitis, unspecified: Secondary | ICD-10-CM | POA: Diagnosis not present

## 2016-07-11 LAB — POCT URINALYSIS DIP (DEVICE)
Glucose, UA: NEGATIVE mg/dL
Hgb urine dipstick: NEGATIVE
KETONES UR: 15 mg/dL — AB
Nitrite: NEGATIVE
PH: 6 (ref 5.0–8.0)
Protein, ur: 100 mg/dL — AB
Specific Gravity, Urine: 1.025 (ref 1.005–1.030)
Urobilinogen, UA: >=8 mg/dL (ref 0.0–1.0)

## 2016-07-11 LAB — POCT PREGNANCY, URINE: PREG TEST UR: NEGATIVE

## 2016-07-11 MED ORDER — IPRATROPIUM BROMIDE 0.02 % IN SOLN
0.5000 mg | Freq: Once | RESPIRATORY_TRACT | Status: AC
  Administered 2016-07-11: 0.5 mg via RESPIRATORY_TRACT

## 2016-07-11 MED ORDER — METHYLPREDNISOLONE 4 MG PO TBPK: ORAL_TABLET | ORAL | 0 refills | Status: AC

## 2016-07-11 MED ORDER — ALBUTEROL SULFATE (2.5 MG/3ML) 0.083% IN NEBU
INHALATION_SOLUTION | RESPIRATORY_TRACT | Status: AC
  Filled 2016-07-11: qty 6

## 2016-07-11 MED ORDER — METHYLPREDNISOLONE SODIUM SUCC 125 MG IJ SOLR
125.0000 mg | Freq: Once | INTRAMUSCULAR | Status: AC
  Administered 2016-07-11: 125 mg via INTRAMUSCULAR

## 2016-07-11 MED ORDER — ONDANSETRON 4 MG PO TBDP
ORAL_TABLET | ORAL | Status: AC
  Filled 2016-07-11: qty 1

## 2016-07-11 MED ORDER — ONDANSETRON 4 MG PO TBDP
4.0000 mg | ORAL_TABLET | Freq: Once | ORAL | Status: AC
  Administered 2016-07-11: 4 mg via ORAL

## 2016-07-11 MED ORDER — GI COCKTAIL ~~LOC~~
30.0000 mL | Freq: Once | ORAL | Status: AC
  Administered 2016-07-11: 30 mL via ORAL

## 2016-07-11 MED ORDER — IPRATROPIUM BROMIDE 0.02 % IN SOLN
RESPIRATORY_TRACT | Status: AC
  Filled 2016-07-11: qty 2.5

## 2016-07-11 MED ORDER — ALBUTEROL SULFATE (2.5 MG/3ML) 0.083% IN NEBU
5.0000 mg | INHALATION_SOLUTION | Freq: Once | RESPIRATORY_TRACT | Status: AC
  Administered 2016-07-11: 5 mg via RESPIRATORY_TRACT

## 2016-07-11 MED ORDER — METHYLPREDNISOLONE SODIUM SUCC 125 MG IJ SOLR
INTRAMUSCULAR | Status: AC
  Filled 2016-07-11: qty 2

## 2016-07-11 MED ORDER — GI COCKTAIL ~~LOC~~
ORAL | Status: AC
  Filled 2016-07-11: qty 30

## 2016-07-11 NOTE — ED Triage Notes (Signed)
Pt  Reports   Symptoms  Of  Cough      Congested   As   Well    As    Earache       And  Fever    Seen  X   2  In the  Er  This  Week      Not  Given        Any  meds

## 2016-07-11 NOTE — ED Provider Notes (Signed)
[facility]    CSN: [id number] Arrival date & time: 07/11/16  1648  First Provider Contact:  None       History   Chief Complaint Chief Complaint  Patient presents with  . Cough    HPI Kristy [name] is a 25 y.o. female.   The history is provided by the patient.  Cough  Cough characteristics:  Non-productive Severity:  Moderate Onset quality:  Gradual Duration:  1 week Progression:  Worsening Chronicity:  New Smoker: no   Context comment:  3rd ER visit this week for current sx. no improvement from prior eval. Associated symptoms: ear pain, rhinorrhea, shortness of breath and sore throat     History reviewed. No pertinent past medical history.  Patient Active Problem List   Diagnosis Date Noted  . SOB (shortness of breath) 01/24/2013  . Weakness 01/24/2013  . Costochondritis 01/10/2013  . Dysmenorrhea 01/10/2013    History reviewed. No pertinent surgical history.  OB History    No data available       Home Medications    Prior to Admission medications   Medication Sig Start Date End Date Taking? Authorizing Provider  azithromycin (ZITHROMAX Z-PAK) 250 MG tablet 2 po day one, then 1 daily x 4 days 11/02/14   April Palumbo, MD  cyclobenzaprine (FLEXERIL) 5 MG tablet Take one tablet before bedtime for one wee 01/10/13   Ok Edwards, MD  ibuprofen (ADVIL,MOTRIN) 800 MG tablet Take 1 tablet (800 mg total) by mouth 3 (three) times daily. 07/07/16   Garlon Hatchet, PA-C  lidocaine (XYLOCAINE) 2 % solution Use as directed 20 mLs in the mouth or throat as needed for mouth pain. 07/07/16   Garlon Hatchet, PA-C  Mefenamic Acid 250 MG CAPS Take 1 capsule (250 mg total) by mouth 4 (four) times daily as needed. 01/27/12   Ok Edwards, MD  naproxen (NAPROSYN) 500 MG tablet Take 1 tablet (500 mg total) by mouth 2 (two) times daily. 11/02/14   April Palumbo, MD  Norethin-Eth Estrad-Fe Biphas (LO LOESTRIN FE PO) Take by mouth.    Historical Provider, MD    predniSONE (STERAPRED UNI-PAK) 10 MG tablet Take 1 tablet (10 mg total) by mouth daily. 10 Mg Steroid Pak for 6 days #21 01/10/13   Ok Edwards, MD    Family History History reviewed. No pertinent family history.  Social History Social History  Substance Use Topics  . Smoking status: Never Smoker  . Smokeless tobacco: Never Used  . Alcohol use No     Allergies   Penicillins   Review of Systems Review of Systems  Constitutional: Negative.   HENT: Positive for ear pain, postnasal drip, rhinorrhea and sore throat.   Respiratory: Positive for cough and shortness of breath.   Cardiovascular: Negative.   All other systems reviewed and are negative.    Physical Exam Triage Vital Signs ED Triage Vitals [07/11/16 1835]  Enc Vitals Group     BP 104/82     Pulse Rate 95     Resp 16     Temp 100.1 F (37.8 C)     Temp Source Oral     SpO2 99 %     Weight      Height      Head Circumference      Peak Flow      Pain Score      Pain Loc      Pain Edu?  Excl. in GC?    No data found.   Updated Vital Signs BP 104/82 (BP Location: Left Arm)   Pulse 95   Temp 100.1 F (37.8 C) (Oral)   Resp 16   LMP 07/06/2016   SpO2 99%   Visual Acuity Right Eye Distance:   Left Eye Distance:   Bilateral Distance:    Right Eye Near:   Left Eye Near:    Bilateral Near:     Physical Exam  Constitutional: She is oriented to person, place, and time. She appears well-developed and well-nourished. No distress.  HENT:  Right Ear: External ear normal.  Left Ear: External ear normal.  Mouth/Throat: Oropharynx is clear and moist.  Eyes: Pupils are equal, round, and reactive to light.  Neck: Normal range of motion. Neck supple.  Cardiovascular: Normal rate and regular rhythm.   Pulmonary/Chest: Effort normal and breath sounds normal.  Lymphadenopathy:    She has no cervical adenopathy.  Neurological: She is alert and oriented to person, place, and time.  Skin: Skin is  warm and dry.  Nursing note and vitals reviewed.    UC Treatments / Results  Labs (all labs ordered are listed, but only abnormal results are displayed) Labs Reviewed  POCT URINALYSIS DIP (DEVICE) - Abnormal; Notable for the following:       Result Value   Bilirubin Urine SMALL (*)    Ketones, ur 15 (*)    Protein, ur 100 (*)    Leukocytes, UA TRACE (*)    All other components within normal limits  POCT PREGNANCY, URINE    EKG  EKG Interpretation None       Radiology No results found. X-rays reviewed and report per radiologist.  Procedures Procedures (including critical care time)  Medications Ordered in UC Medications - No data to display   Initial Impression / Assessment and Plan / UC Course  I have reviewed the triage vital signs and the nursing notes.  Pertinent labs & imaging results that were available during my care of the patient were reviewed by me and considered in my medical decision making (see chart for details).  Clinical Course    Sx improved and pt requests d/c. Very complimentary.  Final Clinical Impressions(s) / UC Diagnoses   Final diagnoses:  None    New Prescriptions New Prescriptions   No medications on file     Linna HoffJames D Ignatz Deis, MD 07/11/16 2118

## 2016-07-11 NOTE — Discharge Instructions (Signed)
Drink plenty of fluids, take all of medicine, return if needed.

## 2017-07-17 ENCOUNTER — Encounter (HOSPITAL_BASED_OUTPATIENT_CLINIC_OR_DEPARTMENT_OTHER): Payer: Self-pay

## 2017-07-17 ENCOUNTER — Emergency Department (HOSPITAL_BASED_OUTPATIENT_CLINIC_OR_DEPARTMENT_OTHER)
Admission: EM | Admit: 2017-07-17 | Discharge: 2017-07-17 | Disposition: A | Discharge status: Home / Self Care | Payer: Managed Care, Other (non HMO) | Attending: Emergency Medicine | Admitting: Emergency Medicine

## 2017-07-17 DIAGNOSIS — M25531 Pain in right wrist: Secondary | ICD-10-CM | POA: Insufficient documentation

## 2017-07-17 DIAGNOSIS — Z5321 Procedure and treatment not carried out due to patient leaving prior to being seen by health care provider: Secondary | ICD-10-CM | POA: Insufficient documentation

## 2017-07-17 NOTE — ED Triage Notes (Signed)
Pt c/o pain and swelling from several bug bites from last night, post painful to right wrist, has not tried any medications at this time

## 2020-11-05 ENCOUNTER — Emergency Department (HOSPITAL_BASED_OUTPATIENT_CLINIC_OR_DEPARTMENT_OTHER)
Admission: EM | Admit: 2020-11-05 | Discharge: 2020-11-05 | Disposition: A | Discharge status: Home / Self Care | Payer: Self-pay | Attending: Emergency Medicine | Admitting: Emergency Medicine

## 2020-11-05 ENCOUNTER — Other Ambulatory Visit: Payer: Self-pay

## 2020-11-05 ENCOUNTER — Encounter (HOSPITAL_BASED_OUTPATIENT_CLINIC_OR_DEPARTMENT_OTHER): Payer: Self-pay | Admitting: Radiology

## 2020-11-05 ENCOUNTER — Emergency Department (HOSPITAL_BASED_OUTPATIENT_CLINIC_OR_DEPARTMENT_OTHER): Payer: Self-pay

## 2020-11-05 ENCOUNTER — Encounter (HOSPITAL_BASED_OUTPATIENT_CLINIC_OR_DEPARTMENT_OTHER): Payer: Self-pay | Admitting: Emergency Medicine

## 2020-11-05 DIAGNOSIS — Z3A00 Weeks of gestation of pregnancy not specified: Secondary | ICD-10-CM | POA: Insufficient documentation

## 2020-11-05 DIAGNOSIS — O98513 Other viral diseases complicating pregnancy, third trimester: Secondary | ICD-10-CM | POA: Insufficient documentation

## 2020-11-05 DIAGNOSIS — R0781 Pleurodynia: Secondary | ICD-10-CM

## 2020-11-05 DIAGNOSIS — R079 Chest pain, unspecified: Secondary | ICD-10-CM

## 2020-11-05 DIAGNOSIS — U071 COVID-19: Secondary | ICD-10-CM | POA: Insufficient documentation

## 2020-11-05 LAB — CBC WITH DIFFERENTIAL/PLATELET
Abs Immature Granulocytes: 0.03 10*3/uL (ref 0.00–0.07)
Basophils Absolute: 0 10*3/uL (ref 0.0–0.1)
Basophils Relative: 0 %
Eosinophils Absolute: 0 10*3/uL (ref 0.0–0.5)
Eosinophils Relative: 0 %
HCT: 31 % — ABNORMAL LOW (ref 36.0–46.0)
Hemoglobin: 10.3 g/dL — ABNORMAL LOW (ref 12.0–15.0)
Immature Granulocytes: 1 %
Lymphocytes Relative: 18 %
Lymphs Abs: 0.9 10*3/uL (ref 0.7–4.0)
MCH: 27.7 pg (ref 26.0–34.0)
MCHC: 33.2 g/dL (ref 30.0–36.0)
MCV: 83.3 fL (ref 80.0–100.0)
Monocytes Absolute: 0.2 10*3/uL (ref 0.1–1.0)
Monocytes Relative: 4 %
Neutro Abs: 3.9 10*3/uL (ref 1.7–7.7)
Neutrophils Relative %: 77 %
Platelets: 219 10*3/uL (ref 150–400)
RBC: 3.72 MIL/uL — ABNORMAL LOW (ref 3.87–5.11)
RDW: 12.8 % (ref 11.5–15.5)
WBC: 5.1 10*3/uL (ref 4.0–10.5)
nRBC: 0 % (ref 0.0–0.2)

## 2020-11-05 LAB — URINALYSIS, ROUTINE W REFLEX MICROSCOPIC
Bilirubin Urine: NEGATIVE
Bilirubin Urine: NEGATIVE
Glucose, UA: NEGATIVE mg/dL
Glucose, UA: NEGATIVE mg/dL
Hgb urine dipstick: NEGATIVE
Hgb urine dipstick: NEGATIVE
Ketones, ur: 40 mg/dL — AB
Ketones, ur: 15 mg/dL — AB
Leukocytes,Ua: NEGATIVE
Nitrite: NEGATIVE
Nitrite: NEGATIVE
Protein, ur: NEGATIVE mg/dL
Protein, ur: NEGATIVE mg/dL
Specific Gravity, Urine: 1.02 (ref 1.005–1.030)
Specific Gravity, Urine: 1.015 (ref 1.005–1.030)
pH: 6 (ref 5.0–8.0)
pH: 7.5 (ref 5.0–8.0)

## 2020-11-05 LAB — RESP PANEL BY RT-PCR (FLU A&B, COVID) ARPGX2
Influenza A by PCR: NEGATIVE
Influenza B by PCR: NEGATIVE
SARS Coronavirus 2 by RT PCR: POSITIVE — AB

## 2020-11-05 LAB — TROPONIN I (HIGH SENSITIVITY)
Troponin I (High Sensitivity): 3 ng/L (ref <18)
Troponin I (High Sensitivity): 2 ng/L (ref <18)

## 2020-11-05 LAB — URINALYSIS, MICROSCOPIC (REFLEX)

## 2020-11-05 LAB — COMPREHENSIVE METABOLIC PANEL
ALT: 50 U/L — ABNORMAL HIGH (ref 0–44)
AST: 51 U/L — ABNORMAL HIGH (ref 15–41)
Albumin: 2.6 g/dL — ABNORMAL LOW (ref 3.5–5.0)
Alkaline Phosphatase: 102 U/L (ref 38–126)
Anion gap: 12 (ref 5–15)
BUN: <5 mg/dL — ABNORMAL LOW (ref 6–20)
CO2: 20 mmol/L — ABNORMAL LOW (ref 22–32)
Calcium: 8.3 mg/dL — ABNORMAL LOW (ref 8.9–10.3)
Chloride: 104 mmol/L (ref 98–111)
Creatinine, Ser: 0.61 mg/dL (ref 0.44–1.00)
GFR, Estimated: >60 mL/min (ref >60)
Glucose, Bld: 118 mg/dL — ABNORMAL HIGH (ref 70–99)
Potassium: 3.1 mmol/L — ABNORMAL LOW (ref 3.5–5.1)
Sodium: 136 mmol/L (ref 135–145)
Total Bilirubin: 0.8 mg/dL (ref 0.3–1.2)
Total Protein: 6.5 g/dL (ref 6.5–8.1)

## 2020-11-05 LAB — LIPASE, BLOOD: Lipase: 23 U/L (ref 11–51)

## 2020-11-05 MED ORDER — SODIUM CHLORIDE 0.9 % IV BOLUS
500.0000 mL | Freq: Once | INTRAVENOUS | Status: AC
  Administered 2020-11-05: 500 mL via INTRAVENOUS

## 2020-11-05 MED ORDER — POTASSIUM CHLORIDE 20 MEQ PO PACK
40.0000 meq | PACK | Freq: Every day | ORAL | Status: DC
  Filled 2020-11-05: qty 2

## 2020-11-05 MED ORDER — IOHEXOL 350 MG/ML SOLN
100.0000 mL | Freq: Once | INTRAVENOUS | Status: AC | PRN
  Administered 2020-11-05: 100 mL via INTRAVENOUS

## 2020-11-05 MED ORDER — POTASSIUM CHLORIDE CRYS ER 20 MEQ PO TBCR
40.0000 meq | EXTENDED_RELEASE_TABLET | Freq: Once | ORAL | Status: AC
  Administered 2020-11-05: 40 meq via ORAL
  Filled 2020-11-05: qty 2

## 2020-11-05 NOTE — Progress Notes (Signed)
[place] with [employer]

## 2020-11-05 NOTE — Discharge Instructions (Addendum)
Call your primary care doctor or specialist as discussed in the next 2-3 days.   Return immediately back to the ER if:  Your symptoms worsen within the next 12-24 hours. You develop new symptoms such as new fevers, persistent vomiting, new pain, shortness of breath, or new weakness or numbness, or if you have any other concerns.  

## 2020-11-05 NOTE — ED Notes (Signed)
Spoke with [facility] with [facility] about Pt. Going to CT scanner and being off TOCO monitor

## 2020-11-05 NOTE — ED Notes (Signed)
Spoke with [facility] with [facility] about patient. Placed on Toco monitor

## 2020-11-05 NOTE — ED Notes (Signed)
[facility]y [name] to inform her that the pt. Is back from CT Scanner.  Pt. Back on toco monitor.  Pt. Fetal HT on toco is 151-171 no c/o contraction or pain in lower abd.

## 2020-11-05 NOTE — Progress Notes (Signed)
Pt d/c from [facility] and is to be d/c home. OB cleared. Dr. Donavan Foil notified. Fine to OB clear pt.

## 2020-11-05 NOTE — Progress Notes (Signed)
Spoke with [facility] RN. FHR tracing is intermittent. Says pt is up to the bathroom and they will adjust her monitor.

## 2020-11-05 NOTE — ED Provider Notes (Signed)
Pending CT imaging which is unremarkable no evidence of pulmonary malaise noted.  Repeat troponin negative as well.  Patient states that she still has intermittent chest pain but she is had that since she was 30 years old with no significant change today.  She had seen a cardiologist in the past, advised her to follow-up with cardiology again within the week.  Advised immediate return for difficulty breathing worsening symptoms or any additional concerns.  Urinalysis did show positive bacteria, however there were multiple squamous cells and I suspect a contaminated sample.  Repeat urinalysis shows negative nitrates negative leukocytes.  Patient otherwise having no urinary tract symptoms.  Recommending follow-up with OB/GYN doctor within the week.  Recommend immediate return for worsening pain difficulty breathing or any additional concerns.   Cheryll Cockayne, MD 11/05/20 [phone]

## 2020-11-05 NOTE — Progress Notes (Signed)
Called [facility] at 1730, [phone], and [phone]. Pt's FHR tracing is intermittent most of the time. No one answered the phone.

## 2020-11-05 NOTE — ED Notes (Signed)
Baby fetal heart tone 170 at time of discharge and no ectopy noted.  Spoke with Corrie Dandy at [facility] at time of discharge.

## 2020-11-05 NOTE — Progress Notes (Signed)
Spoke with [facility] RN. FHR tracing maternal. Says she will adjust pt's monitor.

## 2020-11-05 NOTE — Progress Notes (Signed)
Received call from [facility] RN. Pt is a G2P1, EDC in her prenatal record is 01/22/21, the ED has documented 01/15/21. She is either 28 3/7 weeks or 29 6/7 weeks. She is presenting with c/o a cough for about 4 days, vomiting and a bad headache. The ED RN reports no vaginal bleeding or leaking of fluid. She gets her care in [facility]. She had a vaginal delivery with her first child.

## 2020-11-05 NOTE — ED Notes (Signed)
Spoke with Corrie Dandy at [facility] about discharge of the Pt.   Pt. To go home.    OB happy with tracing of Pt. Heart and no ectopy noted.

## 2020-11-05 NOTE — ED Provider Notes (Signed)
Emergency Department Provider Note   I have reviewed the triage vital signs and the nursing notes.   HISTORY  Chief Complaint Chest Pain   HPI Kristy [name] is a 30 y.o. female with past medical history reviewed below live currently 7 months pregnant with EDD 01/15/21 presents to the emergency department for evaluation of cough with chest pain and headache.  Symptoms of been ongoing for the past 3 days.  She denies productive cough but states she is coughing frequently and forcefully causing her to have mainly right lower quadrant abdominal pain.  Her abdominal pain is only with coughing.  She denies any vaginal bleeding or discharge.  No dysuria, hesitancy, urgency.  No pain to the flanks.  She has developed headache but denies issues with blood pressure during this pregnancy.  She is not experiencing vision change, weakness, numbness feeling.  She notes that cough is her main symptom and she does have some associated chest pain although pain is not only with coughing.  She describes a right-sided chest discomfort that is somewhat worse with deep breathing.  She cautions that she has had pain like this since she was a teenager intermittently and that this does feel similar to those episodes.  She does not recall prior history of PE diagnosis or work-up from the ED. She called her OB/GYN, Dr. Rito Ehrlich describes her symptoms and states she was referred to the emergency department for further evaluation.   History reviewed. No pertinent past medical history.  Patient Active Problem List   Diagnosis Date Noted  . SOB (shortness of breath) 01/24/2013  . Weakness 01/24/2013  . Costochondritis 01/10/2013  . Dysmenorrhea 01/10/2013    History reviewed. No pertinent surgical history.  Allergies Penicillins  No family history on file.  Social History Social History   Tobacco Use  . Smoking status: Never Smoker  . Smokeless tobacco: Never Used  Vaping Use  . Vaping Use: Never  used  Substance Use Topics  . Alcohol use: No  . Drug use: No    Review of Systems  Constitutional: No fever/chills. Positive fatigue and body aches.  Eyes: No visual changes. ENT: Mild sore throat. Cardiovascular: Positive chest pain.  Respiratory: Denies shortness of breath. Positive cough.  Gastrointestinal: Positive RLQ abdominal pain with coughing only. Mild nausea and vomiting.  No diarrhea.  No constipation. Genitourinary: Negative for dysuria. Musculoskeletal: Negative for back pain. Skin: Negative for rash. Neurological: Negative for focal weakness or numbness. Positive HA.   10-point ROS otherwise negative.  ____________________________________________   PHYSICAL EXAM:  VITAL SIGNS: ED Triage Vitals  Enc Vitals Group     BP 11/05/20 1242 115/65     Pulse Rate 11/05/20 1242 (!) 117     Resp 11/05/20 1242 19     Temp 11/05/20 1242 99 F (37.2 C)     Temp src --      SpO2 11/05/20 1242 100 %     Weight 11/05/20 1244 158 lb (71.7 kg)     Height 11/05/20 1244 5\' 3"  (1.6 m)   Constitutional: Alert and oriented. Well appearing and in no acute distress. Frequent dry cough in exam room.  Eyes: Conjunctivae are normal.  Head: Atraumatic. Nose: No congestion/rhinnorhea. Mouth/Throat: Mucous membranes are moist.   Neck: No stridor.   Cardiovascular: Normal rate, regular rhythm. Good peripheral circulation. Grossly normal heart sounds.   Respiratory: Normal respiratory effort.  No retractions. Lungs CTAB. Gastrointestinal: Soft and nontender. Abdomen is gravid. No focal RLQ or  other area of tenderness.  Musculoskeletal: No lower extremity tenderness nor edema. No gross deformities of extremities. Neurologic:  Normal speech and language. No gross focal neurologic deficits are appreciated.  Skin:  Skin is warm, dry and intact. No rash noted. ____________________________________________   LABS (all labs ordered are listed, but only abnormal results are  displayed)  Labs Reviewed  RESP PANEL BY RT-PCR (FLU A&B, COVID) ARPGX2 - Abnormal; Notable for the following components:      Result Value   SARS Coronavirus 2 by RT PCR POSITIVE (*)    All other components within normal limits  URINE CULTURE - Abnormal; Notable for the following components:   Culture MULTIPLE SPECIES PRESENT, SUGGEST RECOLLECTION (*)    All other components within normal limits  COMPREHENSIVE METABOLIC PANEL - Abnormal; Notable for the following components:   Potassium 3.1 (*)    CO2 20 (*)    Glucose, Bld 118 (*)    BUN <5 (*)    Calcium 8.3 (*)    Albumin 2.6 (*)    AST 51 (*)    ALT 50 (*)    All other components within normal limits  CBC WITH DIFFERENTIAL/PLATELET - Abnormal; Notable for the following components:   RBC 3.72 (*)    Hemoglobin 10.3 (*)    HCT 31.0 (*)    All other components within normal limits  URINALYSIS, ROUTINE W REFLEX MICROSCOPIC - Abnormal; Notable for the following components:   Color, Urine ORANGE (*)    Ketones, ur 40 (*)    Leukocytes,Ua MODERATE (*)    All other components within normal limits  URINALYSIS, MICROSCOPIC (REFLEX) - Abnormal; Notable for the following components:   Bacteria, UA MANY (*)    All other components within normal limits  URINALYSIS, ROUTINE W REFLEX MICROSCOPIC - Abnormal; Notable for the following components:   Ketones, ur 15 (*)    All other components within normal limits  LIPASE, BLOOD  TROPONIN I (HIGH SENSITIVITY)  TROPONIN I (HIGH SENSITIVITY)   ____________________________________________  EKG   EKG Interpretation  Date/Time:  Monday November 05 2020 12:50:37 EST Ventricular Rate:  114 PR Interval:    QRS Duration: 69 QT Interval:  303 QTC Calculation: 418 R Axis:   63 Text Interpretation: Sinus tachycardia Borderline T wave abnormalities No STEMI Confirmed by Alona Bene [phone]) on 11/05/2020 1:32:13 PM       ____________________________________________  RADIOLOGY  DG Chest  Portable 1 View  Result Date: 11/05/2020 CLINICAL DATA:  Chest pain EXAM: PORTABLE CHEST 1 VIEW COMPARISON:  07/11/2016 FINDINGS: 1349 hours. Patchy airspace disease noted left lung base. Right lung clear. No pneumothorax or pleural effusion. The cardiopericardial silhouette is within normal limits for size. Telemetry leads overlie the chest. IMPRESSION: Patchy airspace disease at the left lung base suggest pneumonia. Electronically Signed   By: Kennith Center M.D.   On: 11/05/2020 14:27    ____________________________________________   PROCEDURES  Procedure(s) performed:   Procedures  None ____________________________________________   INITIAL IMPRESSION / ASSESSMENT AND PLAN / ED COURSE  Pertinent labs & imaging results that were available during my care of the patient were reviewed by me and considered in my medical decision making (see chart for details).   Patient presents to the emergency department mainly with URI symptoms.  Her abdominal pain is only with coughing and she is nontender on exam.  My suspicion for acute cholecystitis, appendicitis, other acute surgical process in the abdomen is very low.  Similarly my concern for  preterm labor is also low.  I will place her on fetal monitoring given her lower abdominal pain to further assess but seems musculoskeletal and related to cough.   Her chest pain is slightly more concerning.  She is having frequent cough and I am concerned she may have COVID-19.  We will start with chest x-ray and screening blood work along with COVID/flu PCR.  PE is on my differential with some pleuritic type pain although she does describe similar pains in the past.  Pain is not just with coughing.  She describes pain worse with deep breathing.  She is at increased risk for PE given her pregnancy and if she is positive for COVID would have even greater risk for PE.  She may ultimately require a CTA as she would not be adequately risk ratified with D-dimer and  so will not order that here.  Chest x-ray pending which may show infiltrate or other acute process.   02:30 PM  Patient is come back COVID-positive.  Awaiting review by OB of the patient's fetal monitoring and toco tracing but seems reassuring on my quick bedside review.  Spoke with the monoclonal antibody infusion center at [facility] and they will call the patient to review her case and assess her candidacy for monoclonal antibodies.  I discussed with the patient that her pleuritic pain combined with pregnancy and COVID put her at high risk for PE.  We discussed the risks and benefits of CTA during pregnancy.  She is okay with proceeding with this scan and reassess afterwards.   CTA pending. Care transferred to Dr. Audley Hose.  ____________________________________________  FINAL CLINICAL IMPRESSION(S) / ED DIAGNOSES  Final diagnoses:  Pleuritic chest pain  COVID-19  Nonspecific chest pain     MEDICATIONS GIVEN DURING THIS VISIT:  Medications  sodium chloride 0.9 % bolus 500 mL (0 mLs Intravenous Stopped 11/05/20 1508)  iohexol (OMNIPAQUE) 350 MG/ML injection 100 mL (100 mLs Intravenous Contrast Given 11/05/20 1519)  potassium chloride SA (KLOR-CON) CR tablet 40 mEq (40 mEq Oral Given 11/05/20 1750)    Note:  This document was prepared using Dragon voice recognition software and may include unintentional dictation errors.  Alona Bene, MD, [facility] Emergency Medicine    Natina Wiginton, Arlyss Repress, MD 11/08/20 [phone]

## 2020-11-05 NOTE — Progress Notes (Signed)
Spoke with [employer]. Pt is for d/c. She will adjust the pt's FM to get at least 10 min of FHR tracing.

## 2020-11-05 NOTE — Progress Notes (Signed)
Spoke with the Diplomatic [employer] operational officer at [employer]. She says the pt's nurse will call me back.

## 2020-11-05 NOTE — Progress Notes (Signed)
Spoke with [facility] administrator who has spoken to the pt's RN. Pt is still up to the bathroom and she will adjust her fetal monitor when she is back in bed.

## 2020-11-05 NOTE — Progress Notes (Signed)
Dr. Crissie Reese on unit. Reviewed pt's v/s and FHR tracing

## 2020-11-05 NOTE — ED Notes (Signed)
ED Provider at bedside. 

## 2020-11-05 NOTE — ED Triage Notes (Addendum)
7 month OB , pt   EDC 01/15/21  Sent here by OB Cp when she coughs and cough x 3 days  Has been vomiting no vaccine  , has a bad h/a

## 2020-11-07 LAB — URINE CULTURE

## 2022-01-23 ENCOUNTER — Other Ambulatory Visit (HOSPITAL_BASED_OUTPATIENT_CLINIC_OR_DEPARTMENT_OTHER): Payer: Self-pay

## 2022-01-23 ENCOUNTER — Ambulatory Visit: Payer: Self-pay | Attending: Internal Medicine

## 2022-01-23 DIAGNOSIS — Z23 Encounter for immunization: Secondary | ICD-10-CM

## 2022-01-23 MED ORDER — JANSSEN COVID-19 VACCINE 0.5 ML IM SUSP
INTRAMUSCULAR | 0 refills | Status: AC
  Filled 2022-01-23: qty 0.5, 1d supply, fill #0

## 2022-01-23 NOTE — Progress Notes (Signed)
? ?  Covid-19 Vaccination Clinic ? ?Name:  Kristy [name]    ?MRN: [id number] ?DOB: 21-Mar-1991 ? ?01/23/2022 ? ?Kristy [name] was observed post Covid-19 immunization for 15 minutes without incident. She was provided with Vaccine Information Sheet and instruction to access the V-Safe system.  ? ?Kristy [name] was instructed to call 911 with any severe reactions post vaccine: ?Difficulty breathing  ?Swelling of face and throat  ?A fast heartbeat  ?A bad rash all over body  ?Dizziness and weakness  ? ?Immunizations Administered   ? ? Name Date Dose VIS Date Route  ? JANSSEN COVID-19 VACCINE 01/23/2022  9:20 AM 0.5 mL 08/15/2020 Intramuscular  ? Manufacturer: [employer]  ? Lot: [id number]  ? NDC: 64332-951-88  ? ?  ? ? ?

## 2022-02-06 ENCOUNTER — Emergency Department (HOSPITAL_COMMUNITY): Payer: Self-pay

## 2022-02-06 ENCOUNTER — Emergency Department (HOSPITAL_COMMUNITY)
Admission: EM | Admit: 2022-02-06 | Discharge: 2022-02-06 | Disposition: A | Discharge status: Home / Self Care | Payer: Self-pay | Attending: Emergency Medicine | Admitting: Emergency Medicine

## 2022-02-06 ENCOUNTER — Encounter (HOSPITAL_COMMUNITY): Payer: Self-pay

## 2022-02-06 DIAGNOSIS — R102 Pelvic and perineal pain: Secondary | ICD-10-CM

## 2022-02-06 DIAGNOSIS — O039 Complete or unspecified spontaneous abortion without complication: Secondary | ICD-10-CM | POA: Insufficient documentation

## 2022-02-06 DIAGNOSIS — D649 Anemia, unspecified: Secondary | ICD-10-CM | POA: Insufficient documentation

## 2022-02-06 LAB — CBC
HCT: 34.4 % — ABNORMAL LOW (ref 36.0–46.0)
Hemoglobin: 11.2 g/dL — ABNORMAL LOW (ref 12.0–15.0)
MCH: 26.4 pg (ref 26.0–34.0)
MCHC: 32.6 g/dL (ref 30.0–36.0)
MCV: 81.1 fL (ref 80.0–100.0)
Platelets: 243 10*3/uL (ref 150–400)
RBC: 4.24 MIL/uL (ref 3.87–5.11)
RDW: 12.6 % (ref 11.5–15.5)
WBC: 6.2 10*3/uL (ref 4.0–10.5)
nRBC: 0 % (ref 0.0–0.2)

## 2022-02-06 LAB — COMPREHENSIVE METABOLIC PANEL
ALT: 12 U/L (ref 0–44)
AST: 14 U/L — ABNORMAL LOW (ref 15–41)
Albumin: 3.9 g/dL (ref 3.5–5.0)
Alkaline Phosphatase: 58 U/L (ref 38–126)
Anion gap: 7 (ref 5–15)
BUN: 11 mg/dL (ref 6–20)
CO2: 25 mmol/L (ref 22–32)
Calcium: 8.8 mg/dL — ABNORMAL LOW (ref 8.9–10.3)
Chloride: 106 mmol/L (ref 98–111)
Creatinine, Ser: 0.56 mg/dL (ref 0.44–1.00)
GFR, Estimated: >60 mL/min (ref >60)
Glucose, Bld: 94 mg/dL (ref 70–99)
Potassium: 3.6 mmol/L (ref 3.5–5.1)
Sodium: 138 mmol/L (ref 135–145)
Total Bilirubin: 0.4 mg/dL (ref 0.3–1.2)
Total Protein: 8.1 g/dL (ref 6.5–8.1)

## 2022-02-06 LAB — LIPASE, BLOOD: Lipase: 27 U/L (ref 11–51)

## 2022-02-06 LAB — I-STAT BETA HCG BLOOD, ED (MC, WL, AP ONLY): I-stat hCG, quantitative: 58.9 m[IU]/mL — ABNORMAL HIGH (ref <5)

## 2022-02-06 MED ORDER — ONDANSETRON 4 MG PO TBDP: 4.0000 mg | ORAL_TABLET | Freq: Once | ORAL | Status: DC

## 2022-02-06 NOTE — ED Provider Triage Note (Signed)
Emergency Medicine Provider Triage Evaluation Note ? ?Kristy [name] , a 31 y.o. female  was evaluated in triage.  Pt complains of lower abdominal pain, vaginal bleeding, clots, syncope this morning.  Patient reports that she had a miscarriage around 8 weeks ago, was seen in urgent care this morning and sent here for ultrasound to rule out retained products of conception.  Patient reports she has not been sexually active since her miscarriage 8 weeks ago.  She reports that she had discontinuation of menstrual bleeding after miscarriage, return of menstrual bleeding the last few days.  She does endorse nausea, vomiting.  She denies any surgeries in the abdomen.  She has had 2 previous term pregnancies.  She denied any head injury, or other injury with her loss of consciousness earlier this morning. ? ?Review of Systems  ?Positive: Pelvic pain, vaginal bleeding ?Negative: Dysuria, dyspareunia, abdominal surgeries, chest pain, shortness of breath, headache ? ?Physical Exam  ?BP 93/64 (BP Location: Left Arm)   Pulse 97   Temp 98.6 ?F (37 ?C) (Oral)   Resp 16   SpO2 100%  ?Gen:   Awake, no distress   ?Resp:  Normal effort  ?MSK:   Moves extremities without difficulty  ?Other:  Some tenderness to palpation without rebound, rigidity, guarding of the lower abdomen ? ?Medical Decision Making  ?Medically screening exam initiated at 8:21 AM.  Appropriate orders placed.  Taquana Wachsman was informed that the remainder of the evaluation will be completed by another provider, this initial triage assessment does not replace that evaluation, and the importance of remaining in the ED until their evaluation is complete. ? ?Workup initiated ?  ?Anselmo Pickler, PA-C ?02/06/22 [id number] ? ?

## 2022-02-06 NOTE — ED Triage Notes (Signed)
Pt presents with c/o lower abdominal pain since 4 am. Pt reports she did go to another facility but was told to come here for a scan as she did have a miscarriage approx 8 weeks ago. ?

## 2022-02-06 NOTE — ED Provider Notes (Signed)
?[facility]-EMERGENCY DEPT ?Provider Note ? ? ?CSN: [id number] ?Arrival date & time: 02/06/22  0750 ? ?  ? ?History ? ?Chief Complaint  ?Patient presents with  ? Abdominal Pain  ? ? ?Kristy [name] is a 31 y.o. female who presents with lower abdominal pain, vaginal bleeding since 4 AM this morning.  She initially went to another facility but was seen to come here for ultrasound given recent history of miscarriage 8 weeks ago.  Patient reports that she has not been sexually active since her miscarriage.  She was told to come here to rule out retained products of conception.  She reports that her menstrual cycle has been slightly heavier than normal with some clots.  She denies lightheadedness, chest pain, shortness of breath, nausea, vomiting.  She reports that she was around 2 months pregnant at time of miscarriage.  Is any dysuria, vaginal discharge, dyspareunia, Concern for STI. ? ? ?Abdominal Pain ? ?  ? ?Home Medications ?Prior to Admission medications   ?Medication Sig Start Date End Date Taking? Authorizing Provider  ?azithromycin (ZITHROMAX Z-PAK) 250 MG tablet 2 po day one, then 1 daily x 4 days 11/02/14   Palumbo, April, MD  ?COVID-19 Ad26 vaccine, JANSSEN/J&J, (JANSSEN COVID-19 VACCINE) 0.5 ML injection Inject into the muscle. 01/23/22   Judyann Munson, MD  ?cyclobenzaprine (FLEXERIL) 5 MG tablet Take one tablet before bedtime for one wee 01/10/13   Ok Edwards, MD  ?ibuprofen (ADVIL,MOTRIN) 800 MG tablet Take 1 tablet (800 mg total) by mouth 3 (three) times daily. 07/07/16   Garlon Hatchet, PA-C  ?lidocaine (XYLOCAINE) 2 % solution Use as directed 20 mLs in the mouth or throat as needed for mouth pain. 07/07/16   Garlon Hatchet, PA-C  ?Mefenamic Acid 250 MG CAPS Take 1 capsule (250 mg total) by mouth 4 (four) times daily as needed. 01/27/12   Ok Edwards, MD  ?methylPREDNISolone (MEDROL DOSEPAK) 4 MG TBPK tablet follow package directions start on sat. 07/11/16   Linna Hoff, MD  ?naproxen (NAPROSYN) 500 MG tablet Take 1 tablet (500 mg total) by mouth 2 (two) times daily. 11/02/14   Palumbo, April, MD  ?Norethin-Eth Estrad-Fe Biphas (LO LOESTRIN FE PO) Take by mouth.    [provider]  ?predniSONE (STERAPRED UNI-PAK) 10 MG tablet Take 1 tablet (10 mg total) by mouth daily. 10 Mg Steroid Pak for 6 days #21 01/10/13   Ok Edwards, MD  ?   ? ?Allergies    ?Penicillins   ? ?Review of Systems   ?Review of Systems  ?Gastrointestinal:  Positive for abdominal pain.  ?Genitourinary:  Positive for pelvic pain.  ?All other systems reviewed and are negative. ? ?Physical Exam ?Updated Vital Signs ?BP 99/79 (BP Location: Left Arm)   Pulse 80   Temp 98.6 ?F (37 ?C) (Oral)   Resp 18   LMP 01/06/2022 (Approximate)   SpO2 97%  ?Physical Exam ?Vitals and nursing note reviewed.  ?Constitutional:   ?   General: She is not in acute distress. ?   Appearance: Normal appearance.  ?HENT:  ?   Head: Normocephalic and atraumatic.  ?Eyes:  ?   General:     ?   Right eye: No discharge.     ?   Left eye: No discharge.  ?Cardiovascular:  ?   Rate and Rhythm: Normal rate and regular rhythm.  ?   Heart sounds: No murmur heard. ?  No friction rub. No gallop.  ?  Pulmonary:  ?   Effort: Pulmonary effort is normal.  ?   Breath sounds: Normal breath sounds.  ?Abdominal:  ?   General: Bowel sounds are normal.  ?   Palpations: Abdomen is soft.  ?   Comments: Patient with some mild suprapubic tenderness to palpation, no rebound, rigidity, guarding, normal bowel sounds throughout.  No palpable uterine fundus noted.  ?Skin: ?   General: Skin is warm and dry.  ?   Capillary Refill: Capillary refill takes less than 2 seconds.  ?Neurological:  ?   Mental Status: She is alert and oriented to person, place, and time.  ?Psychiatric:     ?   Mood and Affect: Mood normal.     ?   Behavior: Behavior normal.  ? ? ?ED Results / Procedures / Treatments   ?Labs ?(all labs ordered are listed, but only abnormal results are  displayed) ?Labs Reviewed  ?COMPREHENSIVE METABOLIC PANEL - Abnormal; Notable for the following components:  ?    Result Value  ? Calcium 8.8 (*)   ? AST 14 (*)   ? All other components within normal limits  ?CBC - Abnormal; Notable for the following components:  ? Hemoglobin 11.2 (*)   ? HCT 34.4 (*)   ? All other components within normal limits  ?I-STAT BETA HCG BLOOD, ED ([facility], [facility], [facility] ONLY) - Abnormal; Notable for the following components:  ? I-stat hCG, quantitative 58.9 (*)   ? All other components within normal limits  ?LIPASE, BLOOD  ?URINALYSIS, ROUTINE W REFLEX MICROSCOPIC  ? ? ?EKG ?None ? ?Radiology ?US Transvaginal Non-OB ? ?Result Date: 02/06/2022 ?CLINICAL DATA:  Pelvic pain. Concern for retained products of conception. EXAM: TRANSABDOMINAL AND TRANSVAGINAL ULTRASOUND OF PELVIS DOPPLER ULTRASOUND OF OVARIES TECHNIQUE: Both transabdominal and transvaginal ultrasound examinations of the pelvis were performed. Transabdominal technique was performed for global imaging of the pelvis including uterus, ovaries, adnexal regions, and pelvic cul-de-sac. It was necessary to proceed with endovaginal exam following the transabdominal exam to visualize the . Color and duplex Doppler ultrasound was utilized to evaluate blood flow to the ovaries. COMPARISON:  None. FINDINGS: Uterus Measurements: 7.9 x 4.2 x 8.7 cm = volume: 116 mL. Endometrium Thickness: Endometrium is heterogeneous measuring up to 6 mm thickness. No discrete endometrial mass lesion evident. Right ovary Measurements: 3.4 x 3.5 x 3.7 cm = volume: 23 mL. 2.6 cm cystic lesion in the right ovary has a subtle reticular pattern of internal echoes, compatible with hemorrhagic follicle/small cyst. No followup recommended. Left ovary Measurements: 2.9 x 2.0 x 2.1 cm = volume: 6 mL. Normal appearance/no adnexal mass. Pulsed Doppler evaluation of both ovaries demonstrates normal low-resistance arterial and venous waveforms. Other findings Small volume free fluid  noted in the pelvis. IMPRESSION: 1. Endometrial thickness < 10 mm and no focal endometrial mass or vascularity on Color Doppler. No definite sonographic findings for retained products of conception. Electronically Signed   By: Kennith Center M.D.   On: 02/06/2022 09:14  ? ?US Pelvis Complete ? ?Result Date: 02/06/2022 ?CLINICAL DATA:  Pelvic pain. Concern for retained products of conception. EXAM: TRANSABDOMINAL AND TRANSVAGINAL ULTRASOUND OF PELVIS DOPPLER ULTRASOUND OF OVARIES TECHNIQUE: Both transabdominal and transvaginal ultrasound examinations of the pelvis were performed. Transabdominal technique was performed for global imaging of the pelvis including uterus, ovaries, adnexal regions, and pelvic cul-de-sac. It was necessary to proceed with endovaginal exam following the transabdominal exam to visualize the . Color and duplex Doppler ultrasound was utilized to evaluate blood  flow to the ovaries. COMPARISON:  None. FINDINGS: Uterus Measurements: 7.9 x 4.2 x 8.7 cm = volume: 116 mL. Endometrium Thickness: Endometrium is heterogeneous measuring up to 6 mm thickness. No discrete endometrial mass lesion evident. Right ovary Measurements: 3.4 x 3.5 x 3.7 cm = volume: 23 mL. 2.6 cm cystic lesion in the right ovary has a subtle reticular pattern of internal echoes, compatible with hemorrhagic follicle/small cyst. No followup recommended. Left ovary Measurements: 2.9 x 2.0 x 2.1 cm = volume: 6 mL. Normal appearance/no adnexal mass. Pulsed Doppler evaluation of both ovaries demonstrates normal low-resistance arterial and venous waveforms. Other findings Small volume free fluid noted in the pelvis. IMPRESSION: 1. Endometrial thickness < 10 mm and no focal endometrial mass or vascularity on Color Doppler. No definite sonographic findings for retained products of conception. Electronically Signed   By: Kennith CenterEric  Mansell M.D.   On: 02/06/2022 09:14  ? ?US Art/Ven Flow Abd Pelv Doppler ? ?Result Date: 02/06/2022 ?CLINICAL DATA:   Pelvic pain. Concern for retained products of conception. EXAM: TRANSABDOMINAL AND TRANSVAGINAL ULTRASOUND OF PELVIS DOPPLER ULTRASOUND OF OVARIES TECHNIQUE: Both transabdominal and transvaginal ultrasound exam

## 2022-02-06 NOTE — Discharge Instructions (Signed)
As we discussed I recommend that you follow-up in 2 days for a recheck of your beta-hCG level to ensure that you continue to show signs of decrease after your miscarriage.  If your pelvic pain worsens is possible that it is due to some other abdominal abnormality that was not assessed on your work-up today such as appendicitis or other conditions.  If your pain worsens, you have significant worsening of your vaginal bleeding please return for further evaluation.  Otherwise follow-up with your OB/GYN.  It was a pleasure taking care of you today. ?

## 2022-11-28 IMAGING — CT CT ANGIO CHEST
2 of 8 series · 19 of 36 positions shown · IV contrast (Omnipaque)
Comparison: None.

CLINICAL DATA: Pregnant, COVID positive, PE suspected

EXAM:
CT ANGIOGRAPHY CHEST WITH CONTRAST
TECHNIQUE: Multidetector CT imaging of the chest was performed using the
standard protocol during bolus administration of intravenous
contrast. Multiplanar CT image reconstructions and MIPs were
obtained to evaluate the vascular anatomy.
CONTRAST:  100mL OMNIPAQUE IOHEXOL 350 MG/ML SOLN

[Series 6: pe coronal mpr · coronal · 0.45mm/px · 1 of 124 slices shown]
[im 62/124  mediastinal]
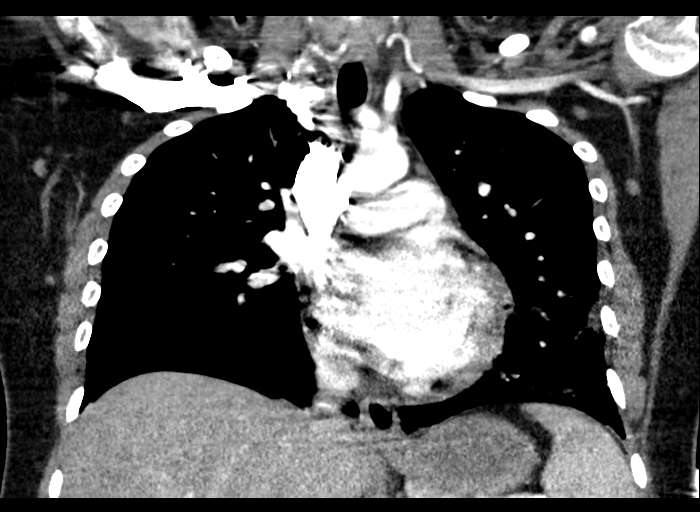

[Series 10: pe thins · axial · 0.69mm/px · z∈[-238,-44]mm · 18 of 218 slices shown]
[im 12/218  lung]
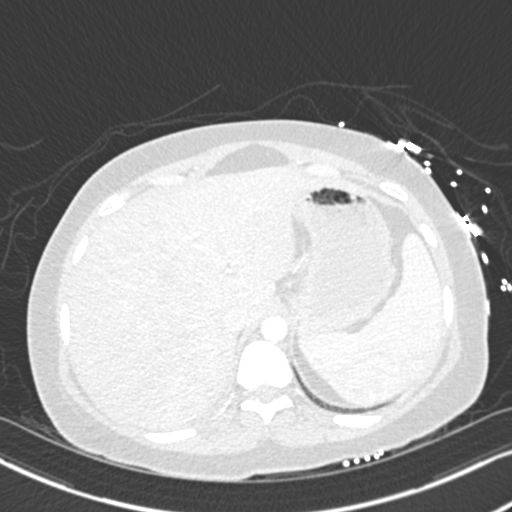
[im 23/218  mediastinal]
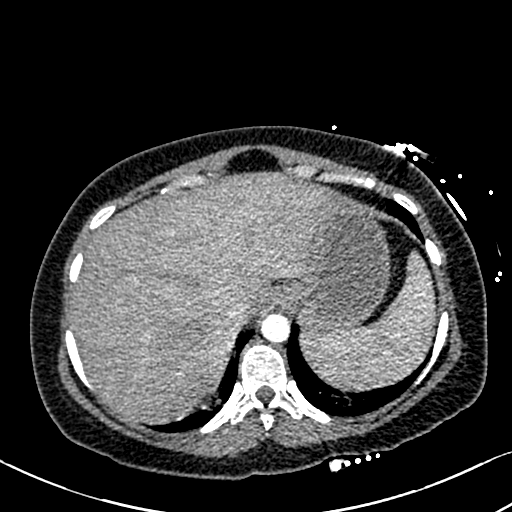
[im 35/218  lung]
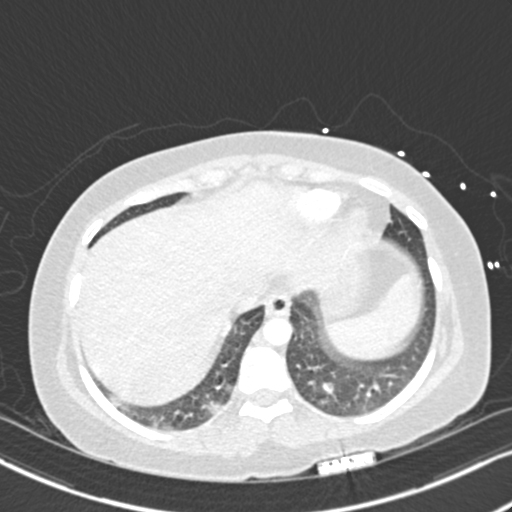
[im 46/218  mediastinal]
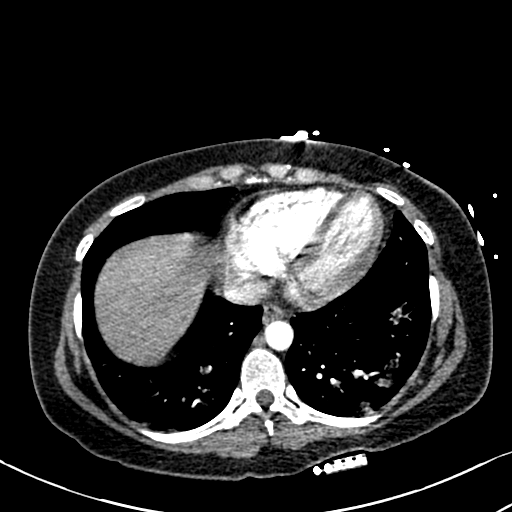
[im 58/218  lung]
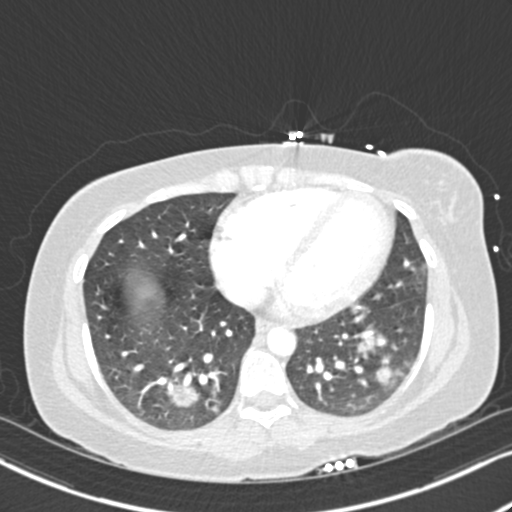
[im 69/218  mediastinal]
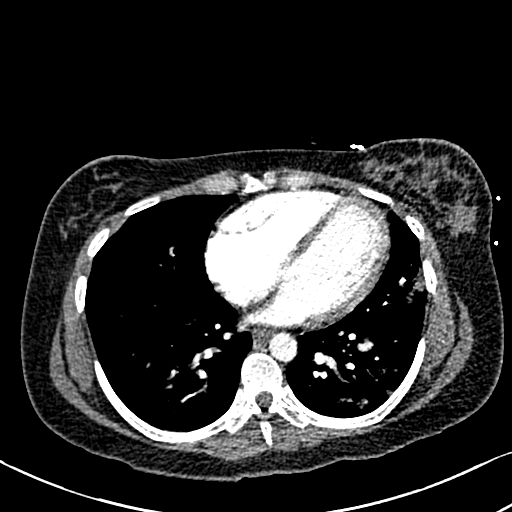
[im 80/218  lung]
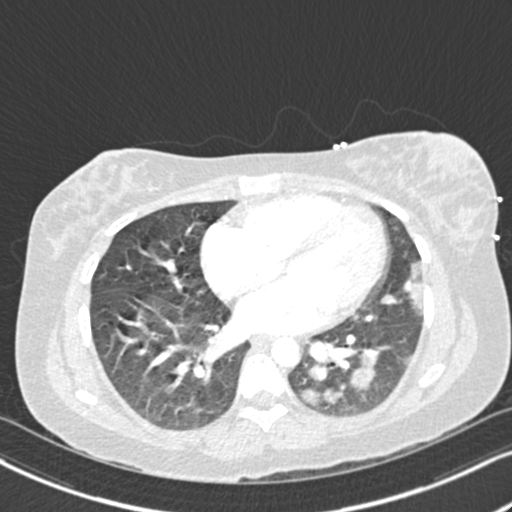
[im 92/218  mediastinal]
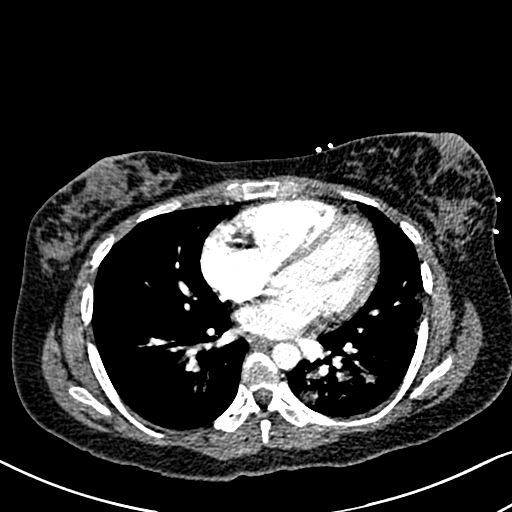
[im 103/218  lung]
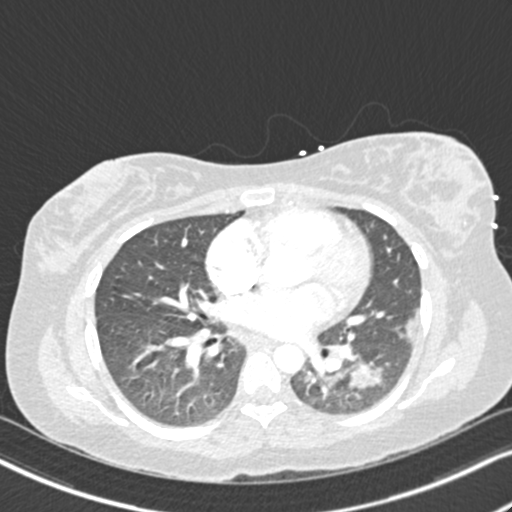
[im 115/218  mediastinal]
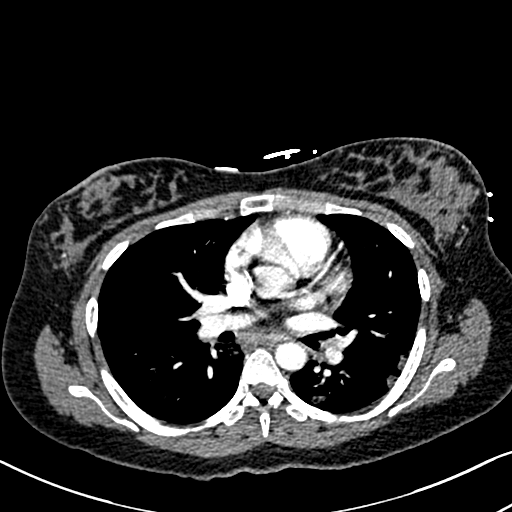
[im 126/218  lung]
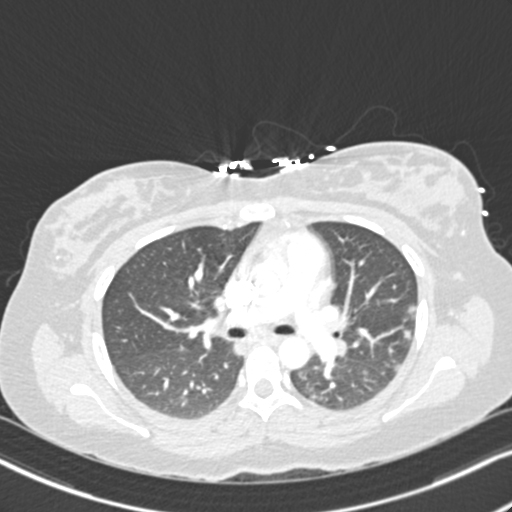
[im 138/218  mediastinal]
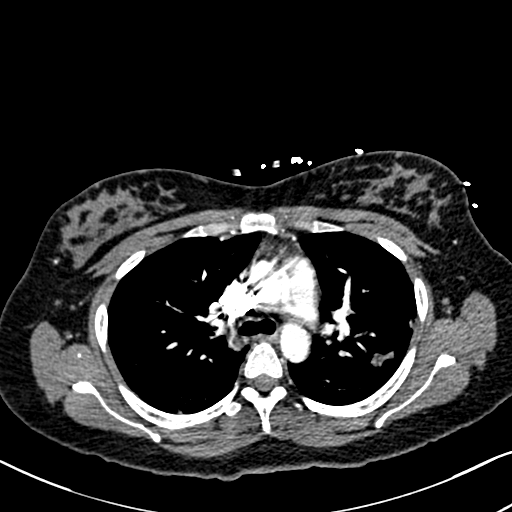
[im 149/218  lung]
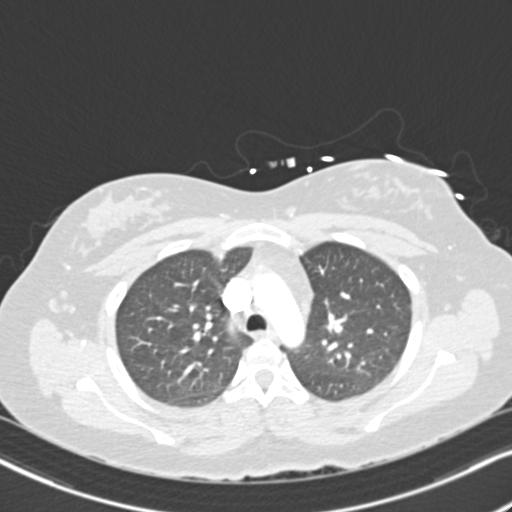
[im 160/218  mediastinal]
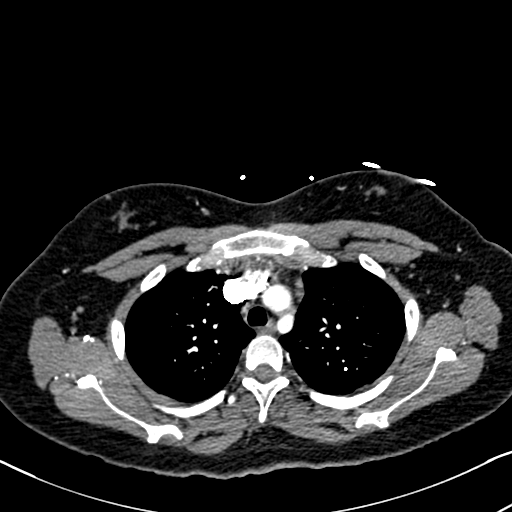
[im 172/218  lung]
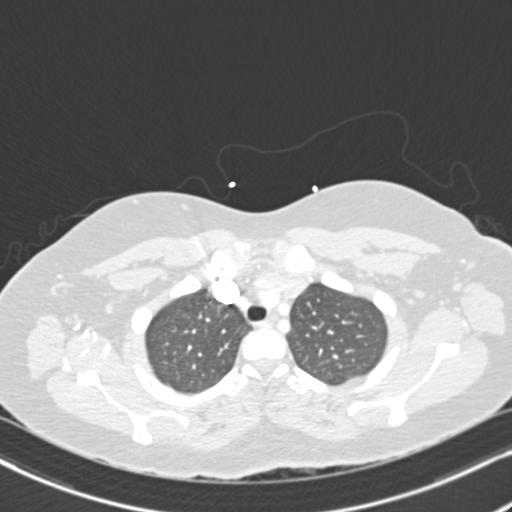
[im 183/218  mediastinal]
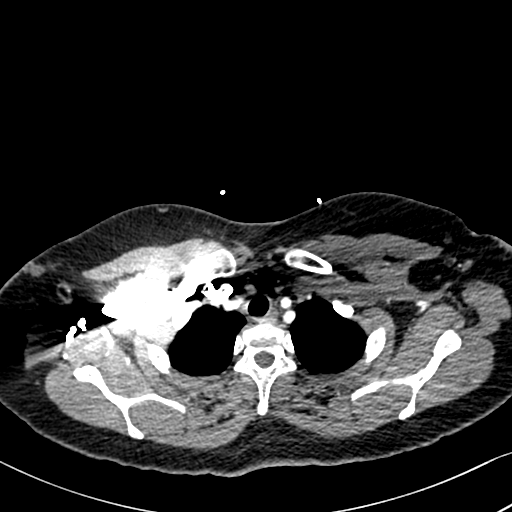
[im 195/218  lung]
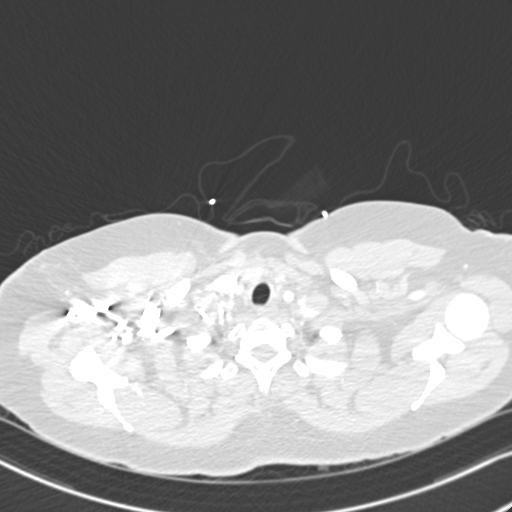
[im 206/218  mediastinal]
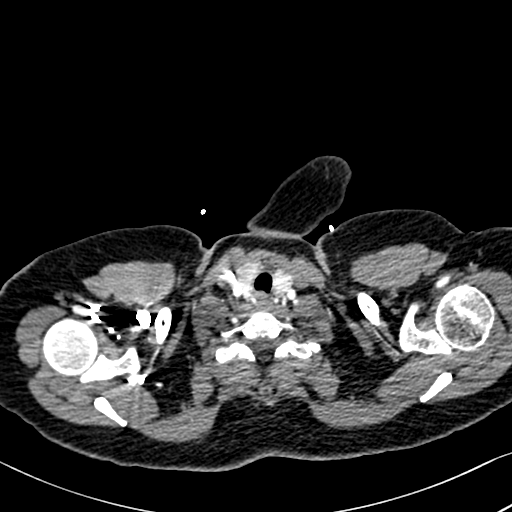

[19 of 36 positions shown; findings below may reference images not displayed]

FINDINGS: Cardiovascular: Satisfactory opacification of the pulmonary arteries
to the segmental level. No evidence of pulmonary embolism. Normal
heart size. No pericardial effusion.

Mediastinum/Nodes: No enlarged mediastinal, hilar, or axillary lymph
nodes. Thyroid gland, trachea, and esophagus demonstrate no
significant findings.

Lungs/Pleura: There is bilateral heterogeneous, somewhat nodular
airspace disease in the lungs, most conspicuous in the left lower
lobe. No pleural effusion or pneumothorax.

Upper Abdomen: No acute abnormality.

Musculoskeletal: No chest wall abnormality. No acute or significant
osseous findings.

Review of the MIP images confirms the above findings.
IMPRESSION: 1. Negative examination for pulmonary embolism.
2. Bilateral heterogeneous, somewhat nodular airspace disease in the
lungs, most conspicuous in the left lower lobe. This appearance is
nonspecific but generally in keeping with reported diagnosis of
COVID airspace disease.

## 2024-02-29 IMAGING — US US PELVIS COMPLETE
1 series · 13 of 25 positions shown · non-contrast
Comparison: None.

CLINICAL DATA: Pelvic pain. Concern for retained products of
conception.

EXAM:
TRANSABDOMINAL AND TRANSVAGINAL ULTRASOUND OF PELVIS
DOPPLER ULTRASOUND OF OVARIES
TECHNIQUE: Both transabdominal and transvaginal ultrasound examinations of the
pelvis were performed. Transabdominal technique was performed for
global imaging of the pelvis including uterus, ovaries, adnexal
regions, and pelvic cul-de-sac.
It was necessary to proceed with endovaginal exam following the
transabdominal exam to visualize the . Color and duplex Doppler
ultrasound was utilized to evaluate blood flow to the ovaries.

[Series 1: us pelvis complete · 13 of 138 slices shown]
[im 1/138]
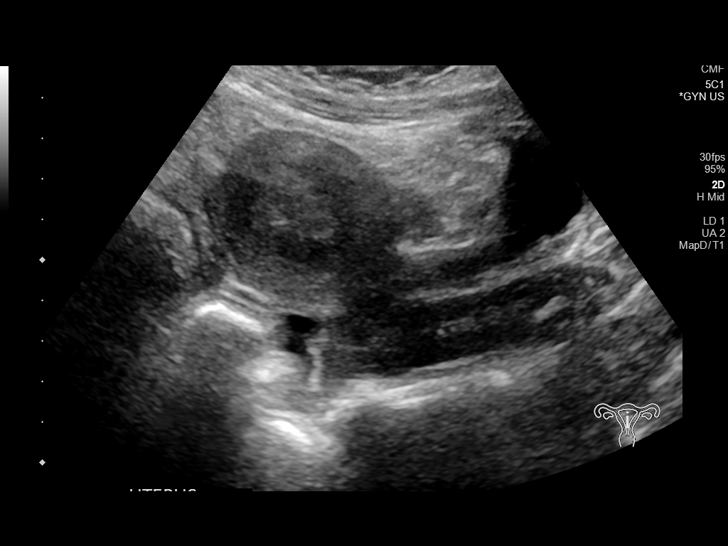
[im 12/138]
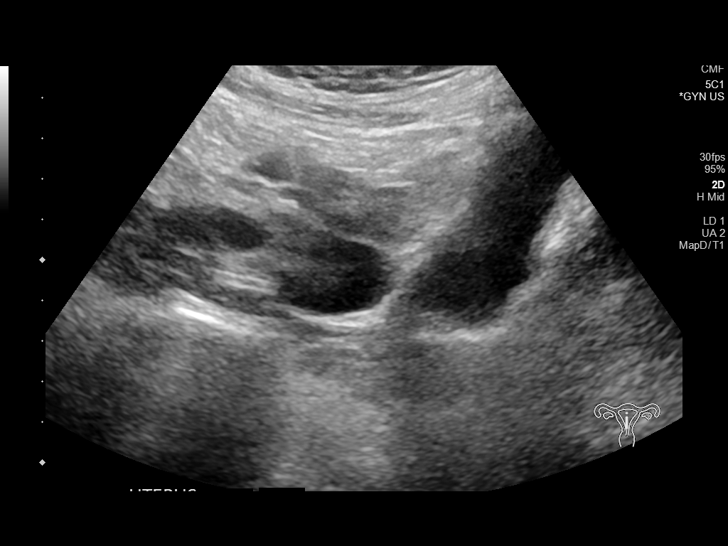
[im 23/138]
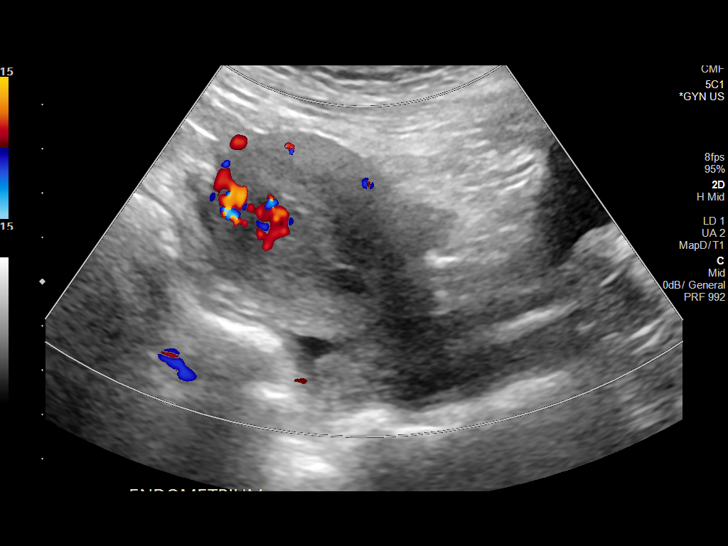
[im 35/138]
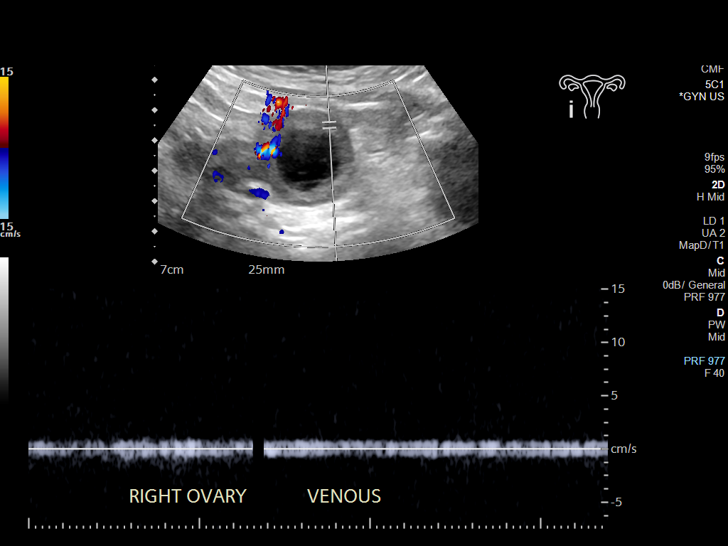
[im 46/138]
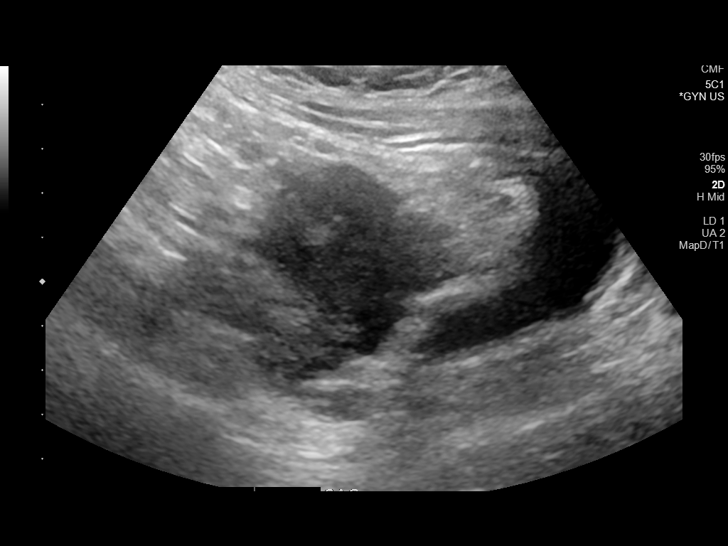
[im 58/138]
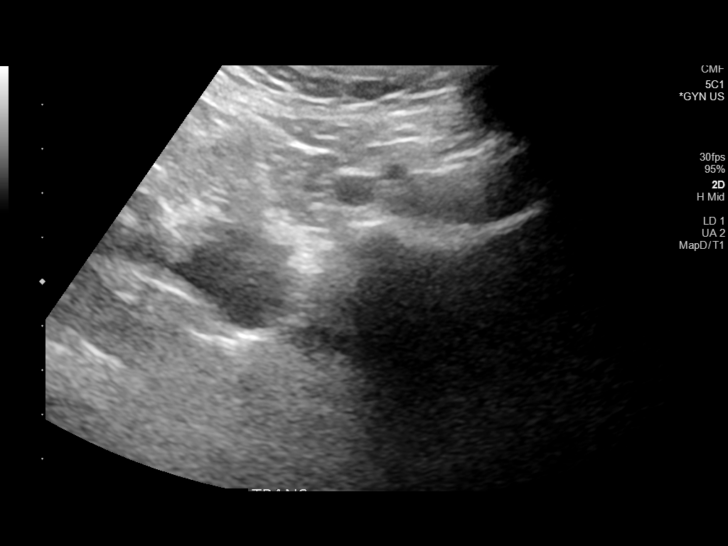
[im 69/138]
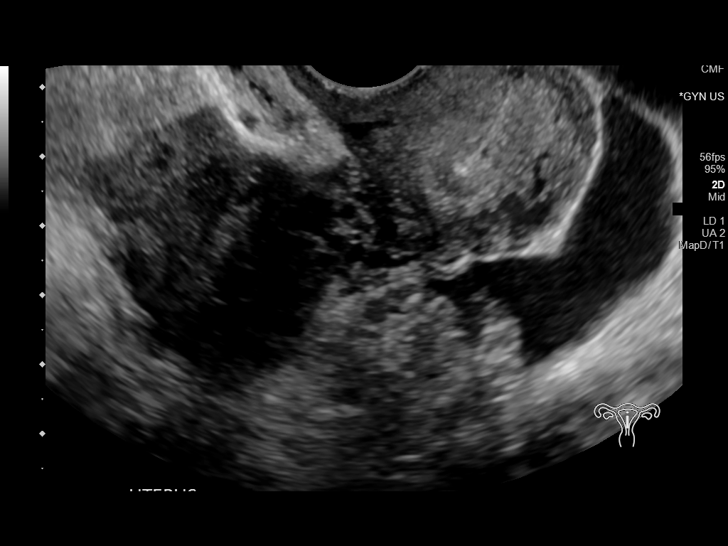
[im 80/138]
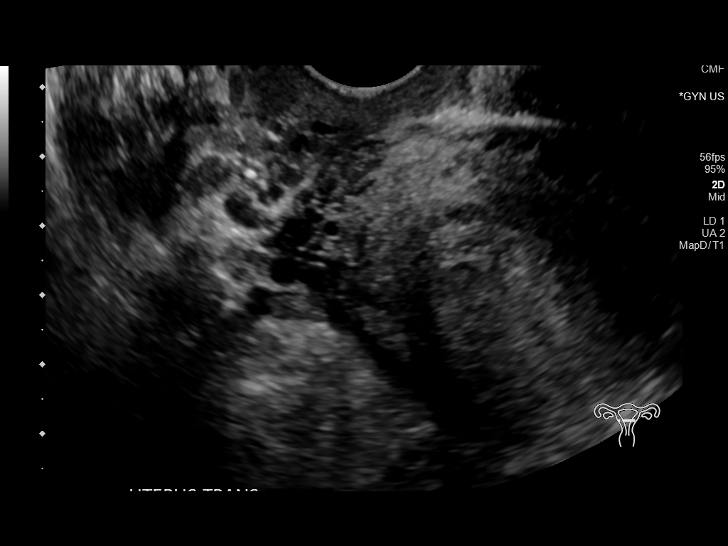
[im 92/138]
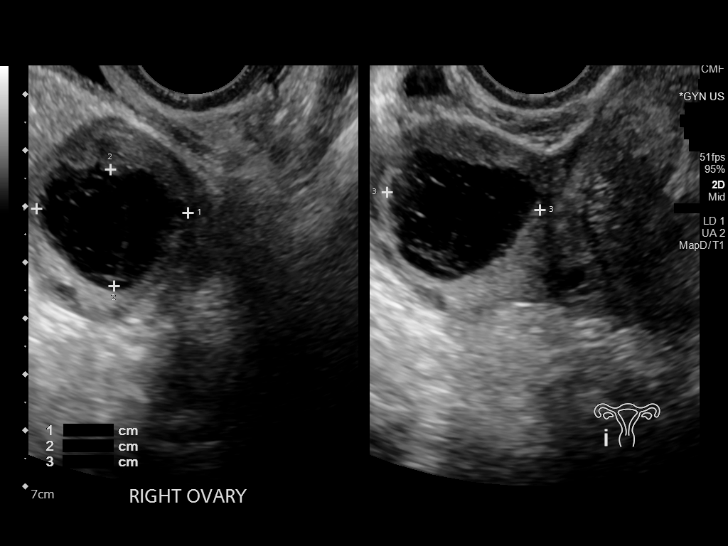
[im 103/138]
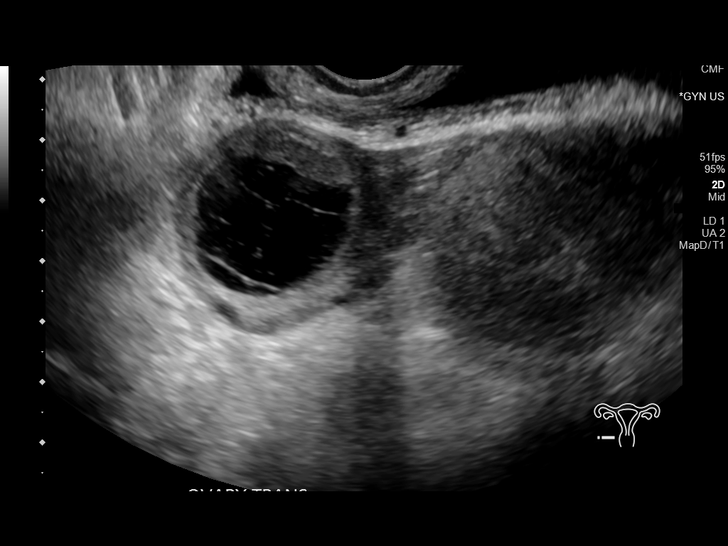
[im 115/138]
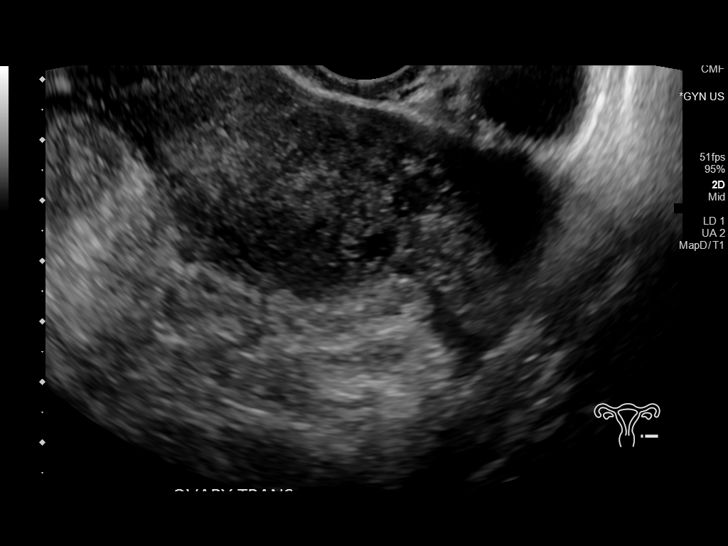
[im 126/138]
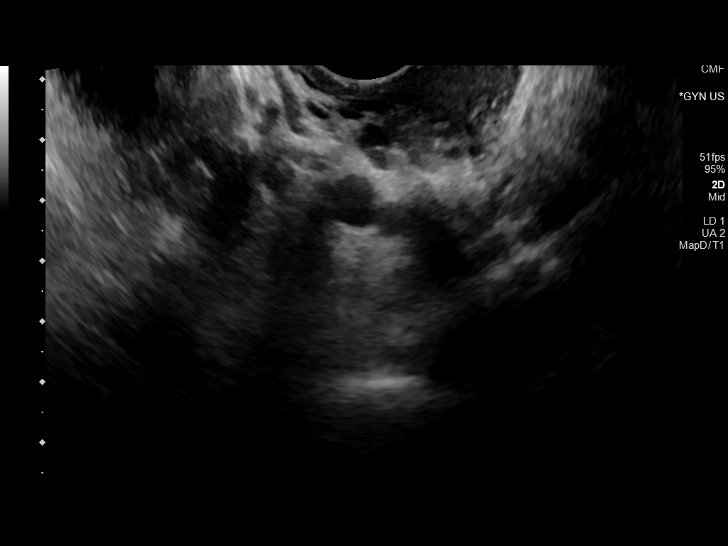
[im 138/138]
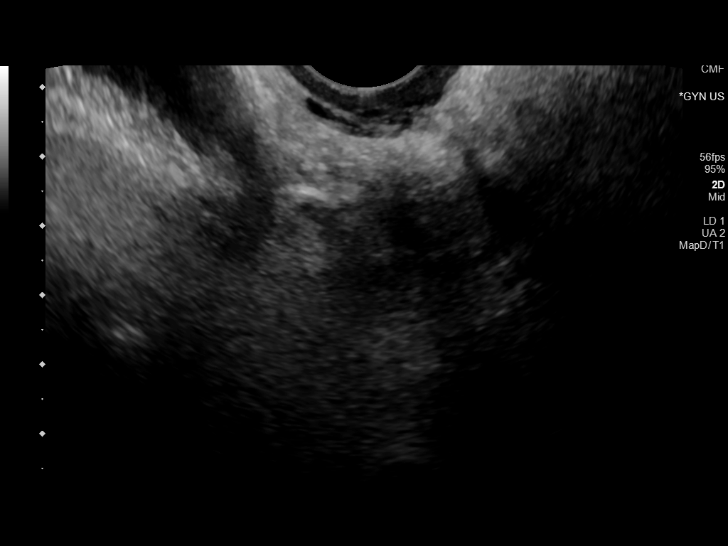

[13 of 25 positions shown; findings below may reference images not displayed]

FINDINGS: Uterus

Measurements: 7.9 x 4.2 x 8.7 cm = volume: 116 mL.

Endometrium

Thickness: Endometrium is heterogeneous measuring up to 6 mm
thickness. No discrete endometrial mass lesion evident.

Right ovary

Measurements: 3.4 x 3.5 x 3.7 cm = volume: 23 mL. 2.6 cm cystic
lesion in the right ovary has a subtle reticular pattern of internal
echoes, compatible with hemorrhagic follicle/small cyst. No followup
recommended.

Left ovary

Measurements: 2.9 x 2.0 x 2.1 cm = volume: 6 mL. Normal
appearance/no adnexal mass.

Pulsed Doppler evaluation of both ovaries demonstrates normal
low-resistance arterial and venous waveforms.

Other findings

Small volume free fluid noted in the pelvis.
IMPRESSION: 1. Endometrial thickness < 10 mm and no focal endometrial mass or
vascularity on
Color Doppler. No definite sonographic findings for retained
products of conception.
